# Patient Record
Sex: Male | Born: 1965 | Race: White | Hispanic: No | Marital: Single | Smoking: Former smoker
Health system: Southern US, Community
[De-identification: ages and names within clinical notes are randomized; demographics above are authoritative.]

## PROBLEM LIST (undated history)

## (undated) DIAGNOSIS — M549 Dorsalgia, unspecified: Secondary | ICD-10-CM

## (undated) HISTORY — PX: SPINAL FUSION: SHX223

## (undated) HISTORY — PX: SKIN BIOPSY: SHX1

## (undated) HISTORY — PX: ARM DEBRIDEMENT: SHX890

## (undated) HISTORY — PX: ELBOW SURGERY: SHX618

## (undated) HISTORY — PX: SHOULDER SURGERY: SHX246

## (undated) HISTORY — PX: APPENDECTOMY: SHX54

---

## 2003-03-04 ENCOUNTER — Emergency Department (HOSPITAL_COMMUNITY): Admission: EM | Admit: 2003-03-04 | Discharge: 2003-03-04 | Payer: Self-pay | Admitting: Emergency Medicine

## 2003-03-04 ENCOUNTER — Encounter: Payer: Self-pay | Admitting: Emergency Medicine

## 2003-03-05 ENCOUNTER — Emergency Department (HOSPITAL_COMMUNITY): Admission: EM | Admit: 2003-03-05 | Discharge: 2003-03-05 | Payer: Self-pay | Admitting: Emergency Medicine

## 2003-03-24 ENCOUNTER — Emergency Department (HOSPITAL_COMMUNITY): Admission: EM | Admit: 2003-03-24 | Discharge: 2003-03-24 | Payer: Self-pay | Admitting: Emergency Medicine

## 2003-04-02 ENCOUNTER — Encounter (HOSPITAL_COMMUNITY): Admission: RE | Admit: 2003-04-02 | Discharge: 2003-05-02 | Payer: Self-pay | Admitting: Orthopaedic Surgery

## 2003-08-31 ENCOUNTER — Emergency Department (HOSPITAL_COMMUNITY): Admission: EM | Admit: 2003-08-31 | Discharge: 2003-08-31 | Payer: Self-pay | Admitting: Emergency Medicine

## 2004-11-16 ENCOUNTER — Emergency Department (HOSPITAL_COMMUNITY): Admission: EM | Admit: 2004-11-16 | Discharge: 2004-11-17 | Payer: Self-pay | Admitting: Emergency Medicine

## 2004-11-24 ENCOUNTER — Emergency Department (HOSPITAL_COMMUNITY): Admission: EM | Admit: 2004-11-24 | Discharge: 2004-11-24 | Payer: Self-pay | Admitting: Emergency Medicine

## 2006-08-12 ENCOUNTER — Emergency Department (HOSPITAL_COMMUNITY): Admission: EM | Admit: 2006-08-12 | Discharge: 2006-08-12 | Payer: Self-pay | Admitting: Emergency Medicine

## 2006-08-24 ENCOUNTER — Emergency Department (HOSPITAL_COMMUNITY): Admission: EM | Admit: 2006-08-24 | Discharge: 2006-08-24 | Payer: Self-pay | Admitting: Emergency Medicine

## 2006-09-11 ENCOUNTER — Emergency Department (HOSPITAL_COMMUNITY): Admission: EM | Admit: 2006-09-11 | Discharge: 2006-09-11 | Payer: Self-pay | Admitting: Emergency Medicine

## 2006-11-13 ENCOUNTER — Emergency Department (HOSPITAL_COMMUNITY): Admission: EM | Admit: 2006-11-13 | Discharge: 2006-11-13 | Payer: Self-pay | Admitting: Emergency Medicine

## 2007-05-14 ENCOUNTER — Emergency Department (HOSPITAL_COMMUNITY): Admission: EM | Admit: 2007-05-14 | Discharge: 2007-05-14 | Payer: Self-pay | Admitting: Emergency Medicine

## 2007-06-11 ENCOUNTER — Emergency Department (HOSPITAL_COMMUNITY): Admission: EM | Admit: 2007-06-11 | Discharge: 2007-06-11 | Payer: Self-pay | Admitting: Emergency Medicine

## 2009-04-13 ENCOUNTER — Emergency Department (HOSPITAL_COMMUNITY): Admission: EM | Admit: 2009-04-13 | Discharge: 2009-04-13 | Payer: Self-pay | Admitting: Emergency Medicine

## 2010-10-13 ENCOUNTER — Emergency Department (HOSPITAL_COMMUNITY)
Admission: EM | Admit: 2010-10-13 | Discharge: 2010-10-13 | Payer: Self-pay | Source: Home / Self Care | Admitting: Emergency Medicine

## 2011-04-18 ENCOUNTER — Emergency Department (HOSPITAL_COMMUNITY)
Admission: EM | Admit: 2011-04-18 | Discharge: 2011-04-18 | Disposition: A | Discharge status: Home / Self Care | Payer: Self-pay | Attending: Emergency Medicine | Admitting: Emergency Medicine

## 2011-04-18 ENCOUNTER — Encounter: Payer: Self-pay | Admitting: Emergency Medicine

## 2011-04-18 DIAGNOSIS — K029 Dental caries, unspecified: Secondary | ICD-10-CM | POA: Insufficient documentation

## 2011-04-18 DIAGNOSIS — F17200 Nicotine dependence, unspecified, uncomplicated: Secondary | ICD-10-CM | POA: Insufficient documentation

## 2011-04-18 MED ORDER — HYDROCODONE-ACETAMINOPHEN 5-325 MG PO TABS: 1.0000 | ORAL_TABLET | ORAL | Status: AC | PRN

## 2011-04-18 MED ORDER — PENICILLIN V POTASSIUM 500 MG PO TABS: 500.0000 mg | ORAL_TABLET | Freq: Four times a day (QID) | ORAL | Status: AC

## 2011-04-18 MED ORDER — OXYCODONE-ACETAMINOPHEN 5-325 MG PO TABS
1.0000 | ORAL_TABLET | Freq: Once | ORAL | Status: AC
  Administered 2011-04-18: 1 via ORAL
  Filled 2011-04-18: qty 1

## 2011-04-18 MED ORDER — PENICILLIN V POTASSIUM 250 MG PO TABS
500.0000 mg | ORAL_TABLET | Freq: Once | ORAL | Status: AC
Start: 2011-04-18 — End: 2011-04-18
  Administered 2011-04-18: 500 mg via ORAL
  Filled 2011-04-18: qty 2

## 2011-04-18 NOTE — ED Notes (Addendum)
No change. Aware PA will be in asap.

## 2011-04-18 NOTE — ED Provider Notes (Signed)
History     Chief Complaint  Patient presents with  . Dental Pain   Patient is a 45 y.o. male presenting with tooth pain. The history is provided by the patient.  Dental PainThe primary symptoms include mouth pain. Primary symptoms do not include headaches, fever, sore throat or angioedema. Primary symptoms comment: dental caries The symptoms began 2 days ago. The symptoms are unchanged. The symptoms are recurrent. The symptoms occur constantly.  Mouth pain began 24 -48 hours ago. Mouth pain occurs constantly. Affected locations include: teeth. At its highest the mouth pain was at 9/10. The mouth pain is currently at 9/10.  Additional symptoms include: gum tenderness. Additional symptoms do not include: gum swelling, trismus, jaw pain, facial swelling, trouble swallowing and ear pain. Medical issues include: periodontal disease.    History reviewed. No pertinent past medical history.  Past Surgical History  Procedure Date  . Appendectomy   . Spinal fusion   . Arm debridement   . Skin biopsy     Family History  Problem Relation Age of Onset  . Hypertension Father   . Asthma Sister     History  Substance Use Topics  . Smoking status: Smoker, Current Status Unknown -- 0.2 packs/day  . Smokeless tobacco: Not on file  . Alcohol Use: No      Review of Systems  Constitutional: Negative.  Negative for fever.  HENT: Positive for dental problem. Negative for ear pain, sore throat, facial swelling and trouble swallowing.   Respiratory: Negative.   Cardiovascular: Negative.   Musculoskeletal: Negative.   Skin: Negative.   Neurological: Negative for headaches.  Hematological: Does not bruise/bleed easily.    Physical Exam  BP 135/100  Pulse 120  Temp(Src) 98.9 F (37.2 C) (Oral)  Resp 22  Ht 5\' 9"  (1.753 m)  Wt 163 lb (73.936 kg)  BMI 24.07 kg/m2  SpO2 98%  Physical Exam  Constitutional: He is oriented to person, place, and time. He appears well-developed and  well-nourished.  Non-toxic appearance.  HENT:  Head: Normocephalic and atraumatic. No trismus in the jaw.  Mouth/Throat: Oropharynx is clear and moist and mucous membranes are normal. Dental caries present. No dental abscesses or uvula swelling.       Multiple dental caries with widespread dental decay.  No periapical abscess  Eyes: EOM are normal. Pupils are equal, round, and reactive to light.  Neck: Normal range of motion. Neck supple.  Cardiovascular: Normal rate, regular rhythm and normal heart sounds.   Pulmonary/Chest: Effort normal and breath sounds normal.  Abdominal: Soft. There is no tenderness.  Musculoskeletal: Normal range of motion.  Lymphadenopathy:    He has no cervical adenopathy.  Neurological: He is alert and oriented to person, place, and time.  Skin: Skin is warm and dry.  Psychiatric: He has a normal mood and affect.    ED Course  Procedures  MDM    PAtient is alert, appears anxious      Tammy L. [place], [place] 04/18/11 1338  Evaluation and management were performed by NP/PA under my supervision/collaboration.  Colette Dicamillo B.   Ahmaud Duthie B. Bernette Mayers, MD 04/20/11 [phone]

## 2011-04-18 NOTE — ED Notes (Signed)
l side upper and lower toothache x 2 days. Pt has appt at dental clinic in 3 weeks

## 2011-07-23 LAB — URINALYSIS, ROUTINE W REFLEX MICROSCOPIC
Bilirubin Urine: NEGATIVE
Glucose, UA: NEGATIVE
Ketones, ur: NEGATIVE
Leukocytes, UA: NEGATIVE
Nitrite: NEGATIVE
Protein, ur: NEGATIVE
Specific Gravity, Urine: <1.005 — ABNORMAL LOW
Urobilinogen, UA: 0.2
pH: 5.5

## 2011-07-23 LAB — URINE MICROSCOPIC-ADD ON

## 2011-07-26 LAB — CBC
HCT: 41.1
Hemoglobin: 14
MCHC: 34
MCV: 91.4
Platelets: 331
RBC: 4.5
RDW: 14.9 — ABNORMAL HIGH
WBC: 14.5 — ABNORMAL HIGH

## 2011-07-26 LAB — DIFFERENTIAL
Basophils Absolute: 0.2 — ABNORMAL HIGH
Basophils Relative: 1
Eosinophils Absolute: 0.7
Eosinophils Relative: 5
Lymphocytes Relative: 36
Lymphs Abs: 5.2 — ABNORMAL HIGH
Monocytes Absolute: 0.8 — ABNORMAL HIGH
Monocytes Relative: 5
Neutro Abs: 7.7
Neutrophils Relative %: 53

## 2011-07-26 LAB — URINALYSIS, ROUTINE W REFLEX MICROSCOPIC
Bilirubin Urine: NEGATIVE
Glucose, UA: NEGATIVE
Ketones, ur: NEGATIVE
Leukocytes, UA: NEGATIVE
Nitrite: NEGATIVE
Protein, ur: NEGATIVE
Specific Gravity, Urine: 1.02
Urobilinogen, UA: 0.2
pH: 6.5

## 2011-07-26 LAB — URINE MICROSCOPIC-ADD ON

## 2011-07-26 LAB — BASIC METABOLIC PANEL
BUN: 6
CO2: 15 — ABNORMAL LOW
Calcium: 8.9
Chloride: 100
Creatinine, Ser: 0.99
GFR calc Af Amer: >60
GFR calc non Af Amer: >60
Glucose, Bld: 112 — ABNORMAL HIGH
Potassium: 3.6
Sodium: 139

## 2011-08-03 ENCOUNTER — Emergency Department (HOSPITAL_COMMUNITY): Payer: Self-pay

## 2011-08-03 ENCOUNTER — Emergency Department (HOSPITAL_COMMUNITY)
Admission: EM | Admit: 2011-08-03 | Discharge: 2011-08-03 | Disposition: A | Discharge status: Home / Self Care | Payer: Self-pay | Attending: Emergency Medicine | Admitting: Emergency Medicine

## 2011-08-03 ENCOUNTER — Encounter (HOSPITAL_COMMUNITY): Payer: Self-pay

## 2011-08-03 DIAGNOSIS — M549 Dorsalgia, unspecified: Secondary | ICD-10-CM | POA: Insufficient documentation

## 2011-08-03 DIAGNOSIS — S20229A Contusion of unspecified back wall of thorax, initial encounter: Secondary | ICD-10-CM | POA: Insufficient documentation

## 2011-08-03 DIAGNOSIS — K029 Dental caries, unspecified: Secondary | ICD-10-CM | POA: Insufficient documentation

## 2011-08-03 DIAGNOSIS — K0889 Other specified disorders of teeth and supporting structures: Secondary | ICD-10-CM

## 2011-08-03 DIAGNOSIS — F17200 Nicotine dependence, unspecified, uncomplicated: Secondary | ICD-10-CM | POA: Insufficient documentation

## 2011-08-03 DIAGNOSIS — W19XXXA Unspecified fall, initial encounter: Secondary | ICD-10-CM

## 2011-08-03 DIAGNOSIS — K089 Disorder of teeth and supporting structures, unspecified: Secondary | ICD-10-CM | POA: Insufficient documentation

## 2011-08-03 DIAGNOSIS — W108XXA Fall (on) (from) other stairs and steps, initial encounter: Secondary | ICD-10-CM | POA: Insufficient documentation

## 2011-08-03 HISTORY — DX: Dorsalgia, unspecified: M54.9

## 2011-08-03 MED ORDER — OXYCODONE-ACETAMINOPHEN 5-325 MG PO TABS: ORAL_TABLET | ORAL | Status: AC

## 2011-08-03 MED ORDER — FENTANYL CITRATE 0.05 MG/ML IJ SOLN
100.0000 ug | Freq: Once | INTRAMUSCULAR | Status: AC
  Administered 2011-08-03: 100 ug via INTRAMUSCULAR
  Filled 2011-08-03: qty 2

## 2011-08-03 MED ORDER — PENICILLIN V POTASSIUM 250 MG PO TABS: 250.0000 mg | ORAL_TABLET | Freq: Four times a day (QID) | ORAL | Status: AC

## 2011-08-03 MED ORDER — OXYCODONE-ACETAMINOPHEN 5-325 MG PO TABS
2.0000 | ORAL_TABLET | Freq: Once | ORAL | Status: AC
  Administered 2011-08-03: 2 via ORAL
  Filled 2011-08-03: qty 2

## 2011-08-03 NOTE — ED Notes (Signed)
Pt c/o pain in his right and left upper mouth and right lower mouth from toothache. Pt also c/o pain in his lower back, mid back and neck from a fall this am. Pt able to ambulate without difficulty. Multiple blackened broken teeth noted in upper and lower mouth. No bruising noted on back.

## 2011-08-03 NOTE — ED Provider Notes (Signed)
History     CSN: [id number] Arrival date & time: 08/03/2011  1:11 PM      Chief Complaint  Patient presents with  . Dental Pain     HPI Pt was seen at 1350.  Per pt, c/o gradual onset and persistence of constant teeth "pain" x3 days.  Denies fevers, no intra-oral edema, no rash, no facial swelling, no dysphagia, no neck pain.   The condition is aggravated by nothing. The condition is relieved by nothing. Pt also c/o sudden onset and resolution of one episode of slip and fall on steps this morning.  States he landed on the left side of his back and "it still hurts."  Denies CP/SOB, no cough, no rash, no abd pain, no incont/retention of bowel or bladder, no saddle anesthesia, no focal motor weakness, no tingling/numbness in extremities.     Past Medical History  Diagnosis Date  . Back pain     Past Surgical History  Procedure Date  . Appendectomy   . Spinal fusion   . Arm debridement   . Skin biopsy   . Shoulder surgery     Family History  Problem Relation Age of Onset  . Hypertension Father   . Asthma Sister     History  Substance Use Topics  . Smoking status: Smoker, Current Status Unknown -- 0.2 packs/day  . Smokeless tobacco: Not on file  . Alcohol Use: No      Review of Systems ROS: Statement: All systems negative except as marked or noted in the HPI; Constitutional: Negative for fever and chills. ; ; Eyes: Negative for eye pain and discharge. ; ; ENMT: Positive for dental caries, dental hygiene poor and toothache. Negative for ear pain, bleeding gums, dental injury, facial deformity, facial swelling, hoarseness, nasal congestion, sinus pressure, sore throat, throat swelling and tongue swollen. ; ; Cardiovascular: Negative for chest pain, palpitations, diaphoresis, dyspnea and peripheral edema. ; ; Respiratory: Negative for cough, wheezing and stridor. ; ; Gastrointestinal: Negative for nausea, vomiting, diarrhea and abdominal pain. ; ; Genitourinary: Negative for  dysuria, flank pain and hematuria. ; ; Musculoskeletal: +back pain. Negative for neck pain. ; ; Skin: Negative for rash and skin lesion. ; ; Neuro: Negative for syncope, headache, lightheadedness and neck stiffness.    Allergies  Aspirin  Home Medications   Current Outpatient Rx  Name Route Sig Dispense Refill  . ASPIRIN-ACETAMINOPHEN 500-325 MG PO PACK Oral Take 1 packet by mouth every 12 (twelve) hours as needed. For pain       BP 115/67  Pulse 130  Temp(Src) 98.8 F (37.1 C) (Oral)  Resp 22  Ht 5\' 8"  (1.727 m)  Wt 163 lb (73.936 kg)  BMI 24.78 kg/m2  SpO2 97%  Physical Exam 1350: Physical examination:  Nursing notes reviewed; Vital signs and O2 SAT reviewed;  Constitutional: Well developed, Well nourished, Well hydrated, In no acute distress; Head:  Normocephalic, atraumatic; Eyes: EOMI, PERRL, No scleral icterus;ENMT: Mouth and pharynx normal, Poor dentition, Widespread dental decay, Left TM normal, Right TM normal, Mucous membranes moist, +bilat upper and lower right and left teeth with extensive dental decay.  No gingival erythema, edema, fluctuance, or drainage.  No hoarse voice, no drooling, no stridor.  ; Neck: Supple, Full range of motion, No lymphadenopathy; Cardiovascular: Regular rate and rhythm, No murmur, rub, or gallop; Respiratory: Breath sounds clear & equal bilaterally, No rales, rhonchi, wheezes, or rub, Normal respiratory effort/excursion; Chest: Nontender, Movement normal; Abdomen: Soft, Nontender, Nondistended, Normal bowel  sounds; Genitourinary: No CVA tenderness; Spine:  No midline CS, TS, LS tenderness.  Well healed surgical scar LS spine.  +mild TTP left lower thoracic and lumbar paraspinal muscles.  No abrasions, no ecchymosis, no soft tissue crepitus. Extremities: Pulses normal, No tenderness, No edema, No calf edema or asymmetry.; Neuro: AA&Ox3, Major CN grossly intact.  No gross focal motor or sensory deficits in extremities.; Skin: Color normal, Warm, Dry, no  rash, no petechiae.    ED Course  Procedures  MDM  MDM Reviewed: nursing note and vitals Interpretation: x-ray    Dg Chest 2 View  08/03/2011  *RADIOLOGY REPORT*  Clinical Data: Back pain, fall  CHEST - 2 VIEW  Comparison: None  Findings: Normal heart size, mediastinal contours, and pulmonary vascularity. Bronchitic and probable emphysematous changes. No infiltrate, pleural effusion or pneumothorax. Age indeterminate mild anterior height loss of a mid thoracic vertebra. Scattered endplate spur formation thoracic spine.  IMPRESSION: Bronchitic and question emphysematous changes. No acute pulmonary abnormalities. Age indeterminate mild anterior height loss of a mid thoracic vertebra; if this is potentially acute in origin, consider CT assessment.  Original Report Authenticated By: Lollie Marrow, M.D.   Dg Lumbar Spine Complete  08/03/2011  *RADIOLOGY REPORT*  Clinical Data: Back pain, fall  LUMBAR SPINE - COMPLETE 4+ VIEW  Comparison: None Correlation:  CT abdomen and pelvis 05/14/2007  Findings: Hypoplastic last rib pair. Five non-rib bearing lumbar vertebrae. Vertebral body and disc space heights maintained. No acute fracture, subluxation or bone destruction. Minimal atherosclerotic calcification aorta. No spondylolysis. Small bony densities are seen within the soft tissues dorsal to the spinous process of L5, unchanged since the prior CT, question related to prior surgery or trauma.  IMPRESSION: No acute lumbar spine abnormalities.  Original Report Authenticated By: Lollie Marrow, M.D.   Ct T Spine Ltd Wo Or W/ Cm  08/03/2011  *RADIOLOGY REPORT*  Clinical Data:  Back pain, fall, abnormal appearance of the thoracic vertebra on chest radiograph  CT THORACIC SPINE WITHOUT CONTRAST  Technique:  Multidetector CT imaging of the thoracic spine was performed from T6-T10 without intravenous contrast administration. Multiplanar CT image reconstructions were also generated.  Comparison: Chest radiograph  08/03/2011  Findings: Multilevel disc space narrowing endplate spur formation involving the imaged segments of the thoracic spine. Vertebral body heights appear maintained without fracture or subluxation. Bony spinal canal appears patent. Visualized portions of the adjacent lungs are normal appearance. No paraspinal hematoma, pleural effusion or rib fracture. No regional bone destruction seen.  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 No thoracic spine fracture identified. The appearing height loss of the T7 vertebral body seen on the preceding lateral chest radiograph was artifactual.  Original Report Authenticated By: Lollie Marrow, M.D.    1645:  No fx on f/u CT scan of TS.  Feels improved after meds and wants to go home now.  Dx testing d/w pt.  Questions answered.  Verb understanding, agreeable to d/c home with outpt f/u.   Medications given in ED:  oxyCODONE-acetaminophen (PERCOCET) 5-325 MG per tablet 2 tablet (2 tablet Oral Given 08/03/11 1416)  fentaNYL (SUBLIMAZE) 0.05 MG/ML injection 100 mcg (100 mcg Intramuscular Given 08/03/11 1635)    New Prescriptions   OXYCODONE-ACETAMINOPHEN (PERCOCET) 5-325 MG PER TABLET    1 or 2 tabs PO q6h prn pain   PENICILLIN V POTASSIUM (VEETID) 250 MG TABLET    Take 1 tablet (250 mg total) by mouth 4 (  four) times daily.     Yann Biehn Allison Quarry, DO 08/05/11 2014

## 2011-08-03 NOTE — ED Notes (Signed)
Pt c/o dental pain x 3 days.  Also says fell today and c/o low and mid back pain.

## 2011-11-19 ENCOUNTER — Encounter (HOSPITAL_COMMUNITY): Payer: Self-pay

## 2011-11-19 ENCOUNTER — Emergency Department (HOSPITAL_COMMUNITY)
Admission: EM | Admit: 2011-11-19 | Discharge: 2011-11-19 | Disposition: A | Discharge status: Home / Self Care | Payer: Self-pay | Attending: Emergency Medicine | Admitting: Emergency Medicine

## 2011-11-19 DIAGNOSIS — K029 Dental caries, unspecified: Secondary | ICD-10-CM | POA: Insufficient documentation

## 2011-11-19 DIAGNOSIS — K047 Periapical abscess without sinus: Secondary | ICD-10-CM | POA: Insufficient documentation

## 2011-11-19 DIAGNOSIS — F17200 Nicotine dependence, unspecified, uncomplicated: Secondary | ICD-10-CM | POA: Insufficient documentation

## 2011-11-19 MED ORDER — AMOXICILLIN 500 MG PO CAPS: 500.0000 mg | ORAL_CAPSULE | Freq: Three times a day (TID) | ORAL | Status: AC

## 2011-11-19 MED ORDER — OXYCODONE-ACETAMINOPHEN 5-325 MG PO TABS
1.0000 | ORAL_TABLET | Freq: Once | ORAL | Status: AC
  Administered 2011-11-19: 1 via ORAL
  Filled 2011-11-19: qty 1

## 2011-11-19 MED ORDER — AMOXICILLIN 250 MG PO CAPS
500.0000 mg | ORAL_CAPSULE | Freq: Once | ORAL | Status: AC
  Administered 2011-11-19: 500 mg via ORAL
  Filled 2011-11-19: qty 2

## 2011-11-19 MED ORDER — OXYCODONE-ACETAMINOPHEN 5-325 MG PO TABS: 1.0000 | ORAL_TABLET | ORAL | Status: AC | PRN

## 2011-11-19 NOTE — ED Provider Notes (Signed)
History     CSN: [id number]  Arrival date & time 11/19/11  1254   First MD Initiated Contact with Patient 11/19/11 1303      Chief Complaint  Patient presents with  . Dental Pain    (Consider location/radiation/quality/duration/timing/severity/associated sxs/prior treatment) Patient is a 46 y.o. male presenting with tooth pain. The history is provided by the patient.  Dental PainThe primary symptoms include mouth pain. Primary symptoms do not include headaches, fever, shortness of breath or sore throat. The symptoms began more than 1 week ago. The symptoms are worsening. The symptoms are recurrent. The symptoms occur constantly.  Additional symptoms include: dental sensitivity to temperature, gum swelling, gum tenderness and purulent gums. Additional symptoms do not include: jaw pain, facial swelling, trouble swallowing and swollen glands.    Past Medical History  Diagnosis Date  . Back pain     Past Surgical History  Procedure Date  . Appendectomy   . Spinal fusion   . Arm debridement   . Skin biopsy   . Shoulder surgery     Family History  Problem Relation Age of Onset  . Hypertension Father   . Asthma Sister     History  Substance Use Topics  . Smoking status: Smoker, Current Status Unknown -- 0.2 packs/day  . Smokeless tobacco: Not on file  . Alcohol Use: No      Review of Systems  Constitutional: Negative for fever.  HENT: Positive for dental problem. Negative for congestion, sore throat, facial swelling, trouble swallowing and neck pain.   Eyes: Negative.   Respiratory: Negative for chest tightness and shortness of breath.   Cardiovascular: Negative for chest pain.  Gastrointestinal: Negative for nausea and abdominal pain.  Genitourinary: Negative.   Musculoskeletal: Negative for joint swelling and arthralgias.  Skin: Negative.  Negative for rash and wound.  Neurological: Negative for dizziness, weakness, light-headedness, numbness and headaches.    Hematological: Negative.   Psychiatric/Behavioral: Negative.     Allergies  Aspirin  Home Medications   Current Outpatient Rx  Name Route Sig Dispense Refill  . AMOXICILLIN 500 MG PO CAPS Oral Take 1 capsule (500 mg total) by mouth 3 (three) times daily. 30 capsule 0  . ASPIRIN-ACETAMINOPHEN 500-325 MG PO PACK Oral Take 1 packet by mouth every 12 (twelve) hours as needed. For pain     . OXYCODONE-ACETAMINOPHEN 5-325 MG PO TABS Oral Take 1 tablet by mouth every 4 (four) hours as needed for pain. 20 tablet 0    BP 131/85  Pulse 121  Temp(Src) 98.3 F (36.8 C) (Oral)  Resp 18  Ht 5\' 8"  (1.727 m)  Wt 161 lb 8 oz (73.256 kg)  BMI 24.56 kg/m2  SpO2 99%  Physical Exam  Constitutional: He is oriented to person, place, and time. He appears well-developed and well-nourished. No distress.  HENT:  Head: Normocephalic and atraumatic.  Right Ear: Tympanic membrane and external ear normal.  Left Ear: Tympanic membrane and external ear normal.  Mouth/Throat: Oropharynx is clear and moist and mucous membranes are normal. No oral lesions. Dental abscesses present. No oropharyngeal exudate.       Poor dentition. Partially edentulous. Multiple fractured teeth, severe decay and multiple locations of increased gingival edema. No obvious abscess noted.  Eyes: Conjunctivae are normal.  Neck: Normal range of motion. Neck supple.  Cardiovascular: Normal rate and normal heart sounds.   Pulmonary/Chest: Effort normal.  Abdominal: He exhibits no distension.  Musculoskeletal: Normal range of motion.  Lymphadenopathy:  He has no cervical adenopathy.  Neurological: He is alert and oriented to person, place, and time.  Skin: Skin is warm and dry. No erythema.  Psychiatric: He has a normal mood and affect.    ED Course  Procedures (including critical care time)  Labs Reviewed - No data to display No results found.   1. Dental caries   2. Dental abscess       MDM  Dental resource guide  given. Percocet, amoxicillin.        Candis Musa, PA 11/19/11 1346

## 2011-11-19 NOTE — ED Notes (Signed)
Pt presents with multiple broken and blackened teeth. Pt states pain is "all over" mouth.

## 2011-11-19 NOTE — ED Provider Notes (Signed)
Medical screening examination/treatment/procedure(s) were performed by non-physician practitioner and as supervising physician I was immediately available for consultation/collaboration.   Joya Gaskins, MD 11/19/11 [phone]

## 2012-03-15 ENCOUNTER — Emergency Department (HOSPITAL_COMMUNITY)
Admission: EM | Admit: 2012-03-15 | Discharge: 2012-03-15 | Disposition: A | Discharge status: Home / Self Care | Payer: Self-pay | Attending: Emergency Medicine | Admitting: Emergency Medicine

## 2012-03-15 ENCOUNTER — Encounter (HOSPITAL_COMMUNITY): Payer: Self-pay | Admitting: Oncology

## 2012-03-15 DIAGNOSIS — K0889 Other specified disorders of teeth and supporting structures: Secondary | ICD-10-CM

## 2012-03-15 DIAGNOSIS — K029 Dental caries, unspecified: Secondary | ICD-10-CM | POA: Insufficient documentation

## 2012-03-15 DIAGNOSIS — Z87891 Personal history of nicotine dependence: Secondary | ICD-10-CM | POA: Insufficient documentation

## 2012-03-15 MED ORDER — HYDROCODONE-ACETAMINOPHEN 5-325 MG PO TABS: 1.0000 | ORAL_TABLET | Freq: Four times a day (QID) | ORAL | Status: AC | PRN

## 2012-03-15 MED ORDER — PENICILLIN V POTASSIUM 500 MG PO TABS: 500.0000 mg | ORAL_TABLET | Freq: Four times a day (QID) | ORAL | Status: AC

## 2012-03-15 MED ORDER — PENICILLIN V POTASSIUM 250 MG PO TABS
500.0000 mg | ORAL_TABLET | Freq: Once | ORAL | Status: AC
  Administered 2012-03-15: 500 mg via ORAL
  Filled 2012-03-15: qty 2

## 2012-03-15 MED ORDER — HYDROCODONE-ACETAMINOPHEN 5-325 MG PO TABS
1.0000 | ORAL_TABLET | Freq: Once | ORAL | Status: AC
  Administered 2012-03-15: 1 via ORAL
  Filled 2012-03-15: qty 1

## 2012-03-15 MED ORDER — IBUPROFEN 800 MG PO TABS
800.0000 mg | ORAL_TABLET | Freq: Once | ORAL | Status: AC
  Administered 2012-03-15: 800 mg via ORAL
  Filled 2012-03-15: qty 1

## 2012-03-15 NOTE — ED Provider Notes (Signed)
Medical screening examination/treatment/procedure(s) were performed by non-physician practitioner and as supervising physician I was immediately available for consultation/collaboration.  Anvith Mauriello, MD 03/15/12 1548 

## 2012-03-15 NOTE — ED Provider Notes (Signed)
History     CSN: [id number]  Arrival date & time 03/15/12  1019   First MD Initiated Contact with Patient 03/15/12 1109      Chief Complaint  Patient presents with  . Dental Pain    (Consider location/radiation/quality/duration/timing/severity/associated sxs/prior treatment) HPI Comments: Plans on having all teeth pulled "when i get my disability in September".  Patient is a 46 y.o. male presenting with tooth pain. The history is provided by the patient. No language interpreter was used.  Dental PainThe primary symptoms include mouth pain. Primary symptoms do not include dental injury or fever. Episode onset: 3 days ago. The symptoms are unchanged. The symptoms occur constantly.  Additional symptoms include: jaw pain. Additional symptoms do not include: facial swelling.    Past Medical History  Diagnosis Date  . Back pain     Past Surgical History  Procedure Date  . Appendectomy   . Spinal fusion   . Arm debridement   . Skin biopsy   . Shoulder surgery     Family History  Problem Relation Age of Onset  . Hypertension Father   . Asthma Sister     History  Substance Use Topics  . Smoking status: Smoker, Current Status Unknown -- 0.2 packs/day  . Smokeless tobacco: Not on file  . Alcohol Use: No      Review of Systems  Constitutional: Negative for fever.  HENT: Positive for dental problem. Negative for facial swelling.   Cardiovascular: Negative for chest pain.  All other systems reviewed and are negative.    Allergies  Aspirin  Home Medications   Current Outpatient Rx  Name Route Sig Dispense Refill  . ASPIRIN-ACETAMINOPHEN 500-325 MG PO PACK Oral Take 1 packet by mouth every 12 (twelve) hours as needed. For pain     . HYDROCODONE-ACETAMINOPHEN 5-325 MG PO TABS Oral Take 1 tablet by mouth every 6 (six) hours as needed for pain. 20 tablet 0  . PENICILLIN V POTASSIUM 500 MG PO TABS Oral Take 1 tablet (500 mg total) by mouth 4 (four) times daily. 40  tablet 0    BP 137/91  Pulse 102  Temp(Src) 98.2 F (36.8 C) (Oral)  Resp 16  SpO2 98%  Physical Exam  Nursing note and vitals reviewed. Constitutional: He is oriented to person, place, and time. He appears well-developed and well-nourished.  HENT:  Head: Normocephalic and atraumatic.  Mouth/Throat: Uvula is midline and mucous membranes are normal. Dental caries present. No dental abscesses or uvula swelling.    Eyes: EOM are normal.  Neck: Normal range of motion.  Cardiovascular: Normal rate, regular rhythm, normal heart sounds and intact distal pulses.   Pulmonary/Chest: Effort normal and breath sounds normal. No respiratory distress.  Abdominal: Soft. He exhibits no distension. There is no tenderness.  Musculoskeletal: Normal range of motion.  Neurological: He is alert and oriented to person, place, and time.  Skin: Skin is warm and dry.  Psychiatric: He has a normal mood and affect. Judgment normal.    ED Course  Procedures (including critical care time)  Labs Reviewed - No data to display No results found.   1. Pain, dental       MDM  obvious dental decay rx-hydrocodone, 20 rx-pen VK 500 mg, 40 OTC ibuprofen 800 mg TID with food.   F/u with dentist of choice.      den  Worthy Rancher, [place] 03/15/12 1148

## 2012-03-15 NOTE — Discharge Instructions (Signed)
Dental Pain A tooth ache may be caused by cavities (tooth decay). Cavities expose the nerve of the tooth to air and hot or cold temperatures. It may come from an infection or abscess (also called a boil or furuncle) around your tooth. It is also often caused by dental caries (tooth decay). This causes the pain you are having. DIAGNOSIS  Your caregiver can diagnose this problem by exam. TREATMENT   If caused by an infection, it may be treated with medications which kill germs (antibiotics) and pain medications as prescribed by your caregiver. Take medications as directed.   Only take over-the-counter or prescription medicines for pain, discomfort, or fever as directed by your caregiver.   Whether the tooth ache today is caused by infection or dental disease, you should see your dentist as soon as possible for further care.  SEEK MEDICAL CARE IF: The exam and treatment you received today has been provided on an emergency basis only. This is not a substitute for complete medical or dental care. If your problem worsens or new problems (symptoms) appear, and you are unable to meet with your dentist, call or return to this location. SEEK IMMEDIATE MEDICAL CARE IF:   You have a fever.   You develop redness and swelling of your face, jaw, or neck.   You are unable to open your mouth.   You have severe pain uncontrolled by pain medicine.  MAKE SURE YOU:   Understand these instructions.   Will watch your condition.   Will get help right away if you are not doing well or get worse.  Document Released: 09/27/2005 Document Revised: 09/16/2011 Document Reviewed: 05/15/2008 [employer] Patient Information 2012 [employer]   Take the meds as directed.  Take ibuprofen up to 800 mg every 8 hrs with food.  Follow up with the dentist of your choice. 

## 2012-03-15 NOTE — ED Notes (Signed)
Alert, talking, call bell on , says he needs something for pain.  Told that PA would be in soon.

## 2012-03-15 NOTE — ED Notes (Signed)
[name] Conley reports dental pain to right upper/lower molars.

## 2012-04-13 ENCOUNTER — Encounter (HOSPITAL_COMMUNITY): Payer: Self-pay | Admitting: *Deleted

## 2012-04-13 ENCOUNTER — Emergency Department (HOSPITAL_COMMUNITY)
Admission: EM | Admit: 2012-04-13 | Discharge: 2012-04-13 | Disposition: A | Discharge status: Home / Self Care | Payer: Self-pay | Attending: Emergency Medicine | Admitting: Emergency Medicine

## 2012-04-13 ENCOUNTER — Emergency Department (HOSPITAL_COMMUNITY): Payer: Self-pay

## 2012-04-13 DIAGNOSIS — W108XXA Fall (on) (from) other stairs and steps, initial encounter: Secondary | ICD-10-CM | POA: Insufficient documentation

## 2012-04-13 DIAGNOSIS — S40029A Contusion of unspecified upper arm, initial encounter: Secondary | ICD-10-CM | POA: Insufficient documentation

## 2012-04-13 DIAGNOSIS — Z7982 Long term (current) use of aspirin: Secondary | ICD-10-CM | POA: Insufficient documentation

## 2012-04-13 DIAGNOSIS — S40021A Contusion of right upper arm, initial encounter: Secondary | ICD-10-CM

## 2012-04-13 DIAGNOSIS — M25519 Pain in unspecified shoulder: Secondary | ICD-10-CM | POA: Insufficient documentation

## 2012-04-13 DIAGNOSIS — W19XXXA Unspecified fall, initial encounter: Secondary | ICD-10-CM

## 2012-04-13 DIAGNOSIS — M25539 Pain in unspecified wrist: Secondary | ICD-10-CM | POA: Insufficient documentation

## 2012-04-13 DIAGNOSIS — F17200 Nicotine dependence, unspecified, uncomplicated: Secondary | ICD-10-CM | POA: Insufficient documentation

## 2012-04-13 MED ORDER — TRAMADOL HCL 50 MG PO TABS: 50.0000 mg | ORAL_TABLET | Freq: Four times a day (QID) | ORAL | Status: AC | PRN

## 2012-04-13 MED ORDER — HYDROCODONE-ACETAMINOPHEN 5-325 MG PO TABS
1.0000 | ORAL_TABLET | Freq: Once | ORAL | Status: AC
  Administered 2012-04-13: 1 via ORAL
  Filled 2012-04-13: qty 1

## 2012-04-13 NOTE — ED Notes (Signed)
Pt states that he fell yesterday due to his bottom steps "giving way", pt states that he landed on right hand, woke up this am with pain in right shoulder, right wrist and right hand, pt states that it is difficult for him to raise his right shoulder, cms intact distal

## 2012-04-13 NOTE — ED Provider Notes (Signed)
History    This chart was scribed for Dione Booze, MD, MD by Smitty Pluck. The patient was seen in room APFT20 and the patient's care was started at 3:42PM.   CSN: [id number]  Arrival date & time 04/13/12  1514   First MD Initiated Contact with Patient 04/13/12 1538      Chief Complaint  Patient presents with  . Fall  . Shoulder Pain  . Wrist Pain    (Consider location/radiation/quality/duration/timing/severity/associated sxs/prior treatment) The history is provided by the patient.   [name] Conley is a 46 y.o. male who presents to the Emergency Department complaining of fall onset 1 day ago while coming down steps. Pt reports catching himself with his hands. He has moderate right shoulder pain radiating down arm to hand onset this AM. Pt has taken one goody powder tablet without relief. Pt reports pain is 10/10. Pt has used heating pad without relief. Pt smokes 1 pack/week. Symptoms have been constant since onset. Pt reports he does not have control of his fingers in right hand. He reports loss of sensation in right hand. Denies head injury and LOC.  Surgeon Dr. Orvan Falconer   Past Medical History  Diagnosis Date  . Back pain     Past Surgical History  Procedure Date  . Appendectomy   . Spinal fusion   . Arm debridement   . Skin biopsy   . Shoulder surgery     Family History  Problem Relation Age of Onset  . Hypertension Father   . Asthma Sister     History  Substance Use Topics  . Smoking status: Current Everyday Smoker -- 0.2 packs/day  . Smokeless tobacco: Not on file  . Alcohol Use: No      Review of Systems  All other systems reviewed and are negative.   10 Systems reviewed and all are negative for acute change except as noted in the HPI.   Allergies  Aspirin  Home Medications   Current Outpatient Rx  Name Route Sig Dispense Refill  . ASPIRIN-ACETAMINOPHEN 500-325 MG PO PACK Oral Take 1 packet by mouth every 12 (twelve) hours as needed. For pain        BP 128/80  Pulse 97  Temp 98.8 F (37.1 C) (Oral)  Resp 20  SpO2 99%  Physical Exam  Nursing note and vitals reviewed. Constitutional: He is oriented to person, place, and time. He appears well-developed and well-nourished. No distress.  HENT:  Head: Normocephalic and atraumatic.  Eyes: Conjunctivae are normal. Pupils are equal, round, and reactive to light.  Neck: Normal range of motion. Neck supple.  Cardiovascular: Normal rate, regular rhythm and normal heart sounds.   Pulmonary/Chest: Effort normal. No respiratory distress.  Musculoskeletal:       Right arm has no deformity but is tender diffusely from shoulder to proximal metacarpals Pain with any passive ROM of shoulder, elbow or wrist No point tenderness Decreased sensation in entire right hand- not following any peripheral nerve pattern. Cap refill is prompt   Neurological: He is alert and oriented to person, place, and time.  Skin: Skin is warm and dry.  Psychiatric: He has a normal mood and affect. His behavior is normal.    ED Course  Procedures (including critical care time) DIAGNOSTIC STUDIES: Oxygen Saturation is 99% on room air, normal by my interpretation.    COORDINATION OF CARE: 3:44PM EDP discusses pt ED treatment with pt.  3:48PM EDP ordered medication: hydrocodone 1 tablet   Dg Forearm Right  04/13/2012  *RADIOLOGY REPORT*  Clinical Data: Fall, pain  RIGHT FOREARM - 2 VIEW  Comparison: none  Findings: Two views of the right forearm submitted.  No acute fracture or subluxation.  IMPRESSION: No acute fracture or subluxation.  Original Report Authenticated By: Natasha Mead, M.D.   Dg Wrist Complete Right  04/13/2012  *RADIOLOGY REPORT*  Clinical Data: Larey Seat with arm and wrist pain  RIGHT WRIST - COMPLETE 3+ VIEW  Comparison: None.  Findings: The radiocarpal joint space appears normal.  The ulnar styloid is intact and the carpal bones are in normal position.  IMPRESSION:  No acute abnormality.  Original Report  Authenticated By: Juline Patch, M.D.   Dg Humerus Right  04/13/2012  *RADIOLOGY REPORT*  Clinical Data: Fall, shoulder pain  RIGHT HUMERUS - 2+ VIEW  Comparison: None.  Findings: Two views of the right humerus submitted.  No acute fracture or subluxation.  No radiopaque foreign body.  IMPRESSION: No acute fracture or subluxation .  Original Report Authenticated By: Natasha Mead, M.D.      1. Fall   2. Contusion of arm, right       MDM  Fall with right arm injury. The patient's complaints of pain seemed out of proportion to physical findings. Also, complains of numbness in the right hand did not follow an anatomic pattern. His prior records are reviewed and he was seen one month ago for dental pain given a prescription for Norco. Reviewed the [place] controlled substance reporting website she is at the prescription was not filled until June 25. When asked about this, he states that his mother holds his prescriptions he thinks that there may be one or 2 pills left. X-rays have been ordered, but I am concerned that he is drug-seeking. Of note, the Norco prescription filled on June 25 is the only narcotic prescription on the [place] controlled substance reporting website.  X-rays are negative for fracture. He is given a sling for comfort and prescriptions given for tramadol.  I personally performed the services described in this documentation, which was scribed in my presence. The recorded information has been reviewed and considered.          Dione Booze, MD 04/13/12 [phone]

## 2012-05-12 ENCOUNTER — Emergency Department (HOSPITAL_COMMUNITY): Payer: Self-pay

## 2012-05-12 ENCOUNTER — Emergency Department (HOSPITAL_COMMUNITY)
Admission: EM | Admit: 2012-05-12 | Discharge: 2012-05-12 | Disposition: A | Discharge status: Home / Self Care | Payer: Self-pay | Attending: Emergency Medicine | Admitting: Emergency Medicine

## 2012-05-12 ENCOUNTER — Encounter (HOSPITAL_COMMUNITY): Payer: Self-pay | Admitting: *Deleted

## 2012-05-12 DIAGNOSIS — M25519 Pain in unspecified shoulder: Secondary | ICD-10-CM | POA: Insufficient documentation

## 2012-05-12 DIAGNOSIS — M549 Dorsalgia, unspecified: Secondary | ICD-10-CM | POA: Insufficient documentation

## 2012-05-12 DIAGNOSIS — M1990 Unspecified osteoarthritis, unspecified site: Secondary | ICD-10-CM | POA: Insufficient documentation

## 2012-05-12 DIAGNOSIS — R51 Headache: Secondary | ICD-10-CM | POA: Insufficient documentation

## 2012-05-12 DIAGNOSIS — M5030 Other cervical disc degeneration, unspecified cervical region: Secondary | ICD-10-CM | POA: Insufficient documentation

## 2012-05-12 LAB — GLUCOSE, CAPILLARY: Glucose-Capillary: 94 mg/dL (ref 70–99)

## 2012-05-12 MED ORDER — DIPHENHYDRAMINE HCL 50 MG/ML IJ SOLN
25.0000 mg | Freq: Once | INTRAMUSCULAR | Status: AC
  Administered 2012-05-12: 25 mg via INTRAVENOUS
  Filled 2012-05-12: qty 1

## 2012-05-12 MED ORDER — METOCLOPRAMIDE HCL 5 MG/ML IJ SOLN
10.0000 mg | Freq: Once | INTRAMUSCULAR | Status: AC
  Administered 2012-05-12: 10 mg via INTRAVENOUS
  Filled 2012-05-12: qty 2

## 2012-05-12 MED ORDER — DEXAMETHASONE 4 MG PO TABS: ORAL_TABLET | ORAL | Status: DC

## 2012-05-12 MED ORDER — BUTALBITAL-APAP-CAFFEINE 50-325-40 MG PO TABS: 1.0000 | ORAL_TABLET | Freq: Four times a day (QID) | ORAL | Status: DC | PRN

## 2012-05-12 NOTE — ED Provider Notes (Signed)
Pt seen with PA No facial droop No arm/leg drift (movement of right arm/leg limited due to pain, but no focal weakness) He is able to ambulate CT imaging already ordered He reports he has had these symptoms since July 7.  I doubt acute CVA or other acute neurologic process BP 119/64  Pulse 105  Temp 98.4 F (36.9 C) (Oral)  Resp 18  Ht 5' 8.5" (1.74 m)  Wt 150 lb (68.04 kg)  BMI 22.48 kg/m2  SpO2 100%   Joya Gaskins, MD 05/12/12 1204

## 2012-05-12 NOTE — ED Notes (Signed)
Headache, onset 3 days ago, worse today.  Pain rt shoulder with movement 7/7,   Pain in rt hip, and pain both knees  Alert, talking,

## 2012-05-12 NOTE — ED Notes (Signed)
Patient states he would like something for his headache. RN Crystal aware.

## 2012-05-12 NOTE — ED Provider Notes (Signed)
History     CSN: [id number]  Arrival date & time 05/12/12  1105   First MD Initiated Contact with Patient 05/12/12 1126      Chief Complaint  Patient presents with  . Headache    (Consider location/radiation/quality/duration/timing/severity/associated sxs/prior treatment) Patient is a 46 y.o. male presenting with headaches.  Headache  This is a recurrent problem. The current episode started more than 2 days ago. The problem occurs hourly. The problem has been gradually worsening. The headache is associated with bright light, activity and loud noise. The pain is located in the right unilateral region. The quality of the pain is described as throbbing. The pain is severe. The pain radiates to the right shoulder. Pertinent negatives include no palpitations and no shortness of breath. He has tried NSAIDs for the symptoms. The treatment provided no relief.    Past Medical History  Diagnosis Date  . Back pain     Past Surgical History  Procedure Date  . Appendectomy   . Spinal fusion   . Arm debridement   . Skin biopsy   . Shoulder surgery   . Elbow surgery     Family History  Problem Relation Age of Onset  . Hypertension Father   . Asthma Sister     History  Substance Use Topics  . Smoking status: Current Everyday Smoker -- 0.2 packs/day    Types: Cigarettes  . Smokeless tobacco: Not on file  . Alcohol Use: No      Review of Systems  Constitutional: Negative for activity change.       All ROS Neg except as noted in HPI  HENT: Negative for nosebleeds and neck pain.   Eyes: Negative for photophobia and discharge.  Respiratory: Negative for cough, shortness of breath and wheezing.   Cardiovascular: Negative for chest pain and palpitations.  Gastrointestinal: Negative for abdominal pain and blood in stool.  Genitourinary: Negative for dysuria, frequency and hematuria.  Musculoskeletal: Positive for back pain and arthralgias.  Skin: Negative.   Neurological:  Positive for headaches. Negative for dizziness, seizures and speech difficulty.  Psychiatric/Behavioral: Negative for hallucinations and confusion.    Allergies  Aspirin  Home Medications   Current Outpatient Rx  Name Route Sig Dispense Refill  . ASPIRIN-ACETAMINOPHEN 500-325 MG PO PACK Oral Take 1 packet by mouth every 12 (twelve) hours as needed. For pain     . GABAPENTIN 300 MG PO CAPS Oral Take 300 mg by mouth at bedtime.    Marland Kitchen LIDOCAINE 5 % EX PTCH Transdermal Place 1 patch onto the skin daily. Remove & Discard patch within 12 hours or as directed by MD    . BUTALBITAL-APAP-CAFFEINE 50-325-40 MG PO TABS Oral Take 1-2 tablets by mouth every 6 (six) hours as needed for headache. 20 tablet 0  . DEXAMETHASONE 4 MG PO TABS  2 tabs day one, then 1 po daily 8 tablet 0    BP 101/63  Pulse 87  Temp 98.3 F (36.8 C) (Oral)  Resp 18  Ht 5' 8.5" (1.74 m)  Wt 150 lb (68.04 kg)  BMI 22.48 kg/m2  SpO2 97%  Physical Exam  Nursing note and vitals reviewed. Constitutional: He is oriented to person, place, and time. He appears well-developed and well-nourished.  Non-toxic appearance.  HENT:  Head: Normocephalic.  Right Ear: Tympanic membrane and external ear normal.  Left Ear: Tympanic membrane and external ear normal.  Eyes: EOM and lids are normal. Pupils are equal, round, and reactive to light.  Neck: Normal range of motion. Neck supple. Carotid bruit is not present.  Cardiovascular: Normal rate, regular rhythm, normal heart sounds, intact distal pulses and normal pulses.   Pulmonary/Chest: Breath sounds normal. No respiratory distress.  Abdominal: Soft. Bowel sounds are normal. There is no tenderness. There is no guarding.  Musculoskeletal: Normal range of motion.       There is pain with attempted range of motion of the right shoulder. There is some muscle tightening in the upper trapezius area. There's no deformity of the shoulder.  There is soreness with range of motion of the  right hip. There is mild crepitus with range of motion of both knees. There is no effusion of the knees. There is no deformity of the right hip. The distal pulses are symmetrical.  Lymphadenopathy:       Head (right side): No submandibular adenopathy present.       Head (left side): No submandibular adenopathy present.    He has no cervical adenopathy.  Neurological: He is alert and oriented to person, place, and time. He has normal strength. No cranial nerve deficit or sensory deficit.  Skin: Skin is warm and dry.  Psychiatric: He has a normal mood and affect. His speech is normal.    ED Course  Procedures (including critical care time)   Labs Reviewed  GLUCOSE, CAPILLARY   Ct Head Wo Contrast  05/12/2012  *RADIOLOGY REPORT*  Clinical Data:  Left-sided head.  Left sided headache.  Right-sided weakness.  CT HEAD WITHOUT CONTRAST CT CERVICAL SPINE WITHOUT CONTRAST  Technique:  Multidetector CT imaging of the head and cervical spine was performed following the standard protocol without intravenous contrast.  Multiplanar CT image reconstructions of the cervical spine were also generated.  Comparison:  None available.  CT HEAD  Findings: No acute intracranial abnormality is present. Specifically, there is no evidence for acute infarct, hemorrhage, mass, hydrocephalus, or extra-axial fluid collection.  The paranasal sinuses and mastoid air cells are clear.  The globes and orbits are intact.  The osseous skull is intact.  IMPRESSION: Negative CT of the head.  CT CERVICAL SPINE  Findings: The cervical spine is imaged from the skull base through the upper thoracic spine.  The vertebral body heights alignment maintained.  Vascular calcifications are noted in the right carotid bifurcation without significant stenosis.  The prevertebral soft tissues are otherwise unremarkable.  The lung apices are clear.  Mild endplate degenerative changes are evident at C4-5.  No acute fracture or traumatic subluxation is  present.  IMPRESSION:  1.  No acute abnormality or evidence for significant stenosis. 2.  Mild degenerative changes at C4-5. 3.  Atherosclerotic changes at the right carotid bifurcation without definite stenosis.  Original Report Authenticated By: Jamesetta Orleans. MATTERN, M.D.   Ct Cervical Spine Wo Contrast  05/12/2012  *RADIOLOGY REPORT*  Clinical Data:  Left-sided head.  Left sided headache.  Right-sided weakness.  CT HEAD WITHOUT CONTRAST CT CERVICAL SPINE WITHOUT CONTRAST  Technique:  Multidetector CT imaging of the head and cervical spine was performed following the standard protocol without intravenous contrast.  Multiplanar CT image reconstructions of the cervical spine were also generated.  Comparison:  None available.  CT HEAD  Findings: No acute intracranial abnormality is present. Specifically, there is no evidence for acute infarct, hemorrhage, mass, hydrocephalus, or extra-axial fluid collection.  The paranasal sinuses and mastoid air cells are clear.  The globes and orbits are intact.  The osseous skull is intact.  IMPRESSION: Negative CT  of the head.  CT CERVICAL SPINE  Findings: The cervical spine is imaged from the skull base through the upper thoracic spine.  The vertebral body heights alignment maintained.  Vascular calcifications are noted in the right carotid bifurcation without significant stenosis.  The prevertebral soft tissues are otherwise unremarkable.  The lung apices are clear.  Mild endplate degenerative changes are evident at C4-5.  No acute fracture or traumatic subluxation is present.  IMPRESSION:  1.  No acute abnormality or evidence for significant stenosis. 2.  Mild degenerative changes at C4-5. 3.  Atherosclerotic changes at the right carotid bifurcation without definite stenosis.  Original Report Authenticated By: Jamesetta Orleans. MATTERN, M.D.     1. Headache   2. DJD (degenerative joint disease)       MDM  I have reviewed nursing notes, vital signs, and all  appropriate lab and imaging results for this patient. The CT scan of the head and neck reveal DJD at C4-5, but no other acute problem. Pt advised to seek a primary MD for completion of the work up and for management of the recurrent headache and joint pains. Rx for dexamethasone and fioricet given to the patient. Pt ambulated in room and hall. No acute deficit noted.       Kathie Dike, [place] 05/28/12 1028

## 2012-05-28 NOTE — ED Provider Notes (Signed)
Medical screening examination/treatment/procedure(s) were performed by non-physician practitioner and as supervising physician I was immediately available for consultation/collaboration.   Joya Gaskins, MD 05/28/12 2234

## 2012-06-16 ENCOUNTER — Emergency Department (HOSPITAL_COMMUNITY)
Admission: EM | Admit: 2012-06-16 | Discharge: 2012-06-16 | Disposition: A | Discharge status: Home / Self Care | Payer: Self-pay | Attending: Emergency Medicine | Admitting: Emergency Medicine

## 2012-06-16 ENCOUNTER — Encounter (HOSPITAL_COMMUNITY): Payer: Self-pay

## 2012-06-16 ENCOUNTER — Emergency Department (HOSPITAL_COMMUNITY): Payer: Self-pay

## 2012-06-16 DIAGNOSIS — F17200 Nicotine dependence, unspecified, uncomplicated: Secondary | ICD-10-CM | POA: Insufficient documentation

## 2012-06-16 DIAGNOSIS — S61409A Unspecified open wound of unspecified hand, initial encounter: Secondary | ICD-10-CM | POA: Insufficient documentation

## 2012-06-16 DIAGNOSIS — Y92009 Unspecified place in unspecified non-institutional (private) residence as the place of occurrence of the external cause: Secondary | ICD-10-CM | POA: Insufficient documentation

## 2012-06-16 DIAGNOSIS — Y990 Civilian activity done for income or pay: Secondary | ICD-10-CM | POA: Insufficient documentation

## 2012-06-16 DIAGNOSIS — W268XXA Contact with other sharp object(s), not elsewhere classified, initial encounter: Secondary | ICD-10-CM | POA: Insufficient documentation

## 2012-06-16 DIAGNOSIS — M25559 Pain in unspecified hip: Secondary | ICD-10-CM | POA: Insufficient documentation

## 2012-06-16 DIAGNOSIS — M25551 Pain in right hip: Secondary | ICD-10-CM

## 2012-06-16 DIAGNOSIS — W19XXXA Unspecified fall, initial encounter: Secondary | ICD-10-CM

## 2012-06-16 DIAGNOSIS — W01119A Fall on same level from slipping, tripping and stumbling with subsequent striking against unspecified sharp object, initial encounter: Secondary | ICD-10-CM | POA: Insufficient documentation

## 2012-06-16 DIAGNOSIS — S61411A Laceration without foreign body of right hand, initial encounter: Secondary | ICD-10-CM

## 2012-06-16 MED ORDER — TETANUS-DIPHTH-ACELL PERTUSSIS 5-2.5-18.5 LF-MCG/0.5 IM SUSP
0.5000 mL | Freq: Once | INTRAMUSCULAR | Status: DC
  Filled 2012-06-16 (×2): qty 0.5

## 2012-06-16 MED ORDER — CEPHALEXIN 500 MG PO CAPS: 500.0000 mg | ORAL_CAPSULE | Freq: Four times a day (QID) | ORAL | Status: AC

## 2012-06-16 MED ORDER — TETANUS-DIPHTHERIA TOXOIDS TD 5-2 LFU IM INJ
0.5000 mL | INJECTION | Freq: Once | INTRAMUSCULAR | Status: AC
  Administered 2012-06-16: 0.5 mL via INTRAMUSCULAR

## 2012-06-16 MED ORDER — HYDROMORPHONE HCL PF 1 MG/ML IJ SOLN
1.0000 mg | Freq: Once | INTRAMUSCULAR | Status: AC
  Administered 2012-06-16: 1 mg via INTRAMUSCULAR
  Filled 2012-06-16: qty 1

## 2012-06-16 MED ORDER — OXYCODONE-ACETAMINOPHEN 5-325 MG PO TABS: 1.0000 | ORAL_TABLET | ORAL | Status: AC | PRN

## 2012-06-16 NOTE — ED Notes (Signed)
Pt fell thru flooring of old house, app 2 feet drop.  Alert, pain in back and hips. Lac to rt  Hand, No HI, alert.

## 2012-06-16 NOTE — ED Provider Notes (Signed)
History     CSN: [id number]  Arrival date & time 06/16/12  1233   First MD Initiated Contact with Patient 06/16/12 1249      Chief Complaint  Patient presents with  . Laceration    (Consider location/radiation/quality/duration/timing/severity/associated sxs/prior treatment) HPI Comments: [name] Conley presents for evaluation of a lab to his right hand and pain in his right hip after falling yesterday when his foot broke the floor boards in a home he was working on.  During the fall he caught his right hand on a nail causing laceration to the right hand.  He claims his laceration using alcohol and has kept it bandaged but is concerned for possible infection he presents today for further management.  Pain is worse with palpation.  He describes pain in his right lateral hip which is also with movement and range of motion and palpation.  He denies any numbness or weakness in his right lower extremity.  He has used [place] powders without significant improvement in his pain.  He has also used antibiotic ointment on his hand laceration.  The history is provided by the patient.    Past Medical History  Diagnosis Date  . Back pain     Past Surgical History  Procedure Date  . Appendectomy   . Spinal fusion   . Arm debridement   . Skin biopsy   . Shoulder surgery   . Elbow surgery     Family History  Problem Relation Age of Onset  . Hypertension Father   . Asthma Sister     History  Substance Use Topics  . Smoking status: Current Everyday Smoker -- 0.2 packs/day    Types: Cigarettes  . Smokeless tobacco: Not on file  . Alcohol Use: No      Review of Systems  Constitutional: Negative for fever.  HENT: Negative for congestion, sore throat and neck pain.   Eyes: Negative.   Respiratory: Negative for chest tightness and shortness of breath.   Cardiovascular: Negative for chest pain.  Gastrointestinal: Negative for nausea and abdominal pain.  Genitourinary: Negative.     Musculoskeletal: Positive for arthralgias. Negative for joint swelling.  Skin: Positive for wound. Negative for rash.  Neurological: Negative for dizziness, weakness, light-headedness, numbness and headaches.  Hematological: Negative.   Psychiatric/Behavioral: Negative.     Allergies  Aspirin  Home Medications   Current Outpatient Rx  Name Route Sig Dispense Refill  . ASPIRIN-ACETAMINOPHEN 500-325 MG PO PACK Oral Take 1 packet by mouth every 12 (twelve) hours as needed. For pain     . GABAPENTIN 300 MG PO CAPS Oral Take 300 mg by mouth at bedtime.    Marland Kitchen LIDOCAINE 5 % EX PTCH Transdermal Place 1 patch onto the skin daily. Remove & Discard patch within 12 hours or as directed by MD    . CEPHALEXIN 500 MG PO CAPS Oral Take 1 capsule (500 mg total) by mouth 4 (four) times daily. 28 capsule 0  . OXYCODONE-ACETAMINOPHEN 5-325 MG PO TABS Oral Take 1 tablet by mouth every 4 (four) hours as needed for pain. 20 tablet 0    BP 116/93  Pulse 108  Temp 98.5 F (36.9 C) (Oral)  Resp 20  Ht 5\' 8"  (1.727 m)  Wt 155 lb (70.308 kg)  BMI 23.57 kg/m2  SpO2 99%  Physical Exam  Nursing note and vitals reviewed. Constitutional: He appears well-developed and well-nourished.  HENT:  Head: Normocephalic and atraumatic.  Eyes: Conjunctivae are normal.  Neck: Normal  range of motion.  Cardiovascular: Normal rate, regular rhythm, normal heart sounds and intact distal pulses.        Vital signs reviewed.  Patient is not tachycardic at time of exam.  Pulmonary/Chest: Effort normal and breath sounds normal. He has no wheezes.  Abdominal: Soft. Bowel sounds are normal. There is no tenderness.  Musculoskeletal: Normal range of motion. He exhibits tenderness. He exhibits no edema.       Right hand: normal sensation noted. Decreased strength noted.       Legs:      No ecchymosis or hematoma appreciated at right hip, no abrasions, skin intact.  He does have decreased strength in his right fourth and fifth  fingers which he states is baseline do to an old severe infection in his right forearm and is not worse today.  He has a 1.5 cm laceration across his volar hand proximal to the fifth finger.  There is no surrounding erythema, laceration is irregular and appears to have started to approximate.  There is no edema or drainage, no red streaking.  Wound edges appear macerated.   Neurological: He is alert.  Skin: Skin is warm and dry.  Psychiatric: He has a normal mood and affect.    ED Course  Procedures (including critical care time)  Labs Reviewed - No data to display Dg Hip Complete Right  06/16/2012  *RADIOLOGY REPORT*  Clinical Data: Fall, right hip pain  RIGHT HIP - COMPLETE 2+ VIEW  Comparison: None.  Findings: No fracture or dislocation is seen.  The bilateral hip joint spaces are symmetric.  The visualized bony pelvis appears intact.  IMPRESSION: No fracture or dislocation is seen.   Original Report Authenticated By: Charline Bills, M.D.    Dg Hand Complete Right  06/16/2012  *RADIOLOGY REPORT*  Clinical Data: Fall, cut with rusty nail along the right fifth metacarpal  RIGHT HAND - COMPLETE 3+ VIEW  Comparison: None.  Findings: No fracture or dislocation is seen.  The joint spaces are preserved.  Visualized soft tissues are grossly unremarkable.  No radiopaque foreign body is seen.  IMPRESSION: No fracture, dislocation, or radiopaque foreign body is seen.   Original Report Authenticated By: Charline Bills, M.D.      1. Laceration of right hand   2. Hip pain, right   3. Fall    LACERATION REPAIR Performed by: Burgess Amor Authorized by: Burgess Amor Consent: Verbal consent obtained. Risks and benefits: risks, benefits and alternatives were discussed Consent given by: patient Patient identity confirmed: provided demographic data Prepped and Draped in normal sterile fashion Wound explored  Laceration Location: Right hand  Laceration Length: 1.5 cm  No Foreign Bodies seen or  palpated  Anesthesia: None   Local anesthetic: None Anesthetic total: None   Irrigation method: syringe Amount of cleaning: standard  Skin closure: Steri-Strips   Number of sutures: 3   Technique: N/A   Patient tolerance: Patient tolerated the procedure well with no immediate complications.    MDM  X-rays reviewed and discussed with patient.  He was placed on Keflex for prophylactic infection given this is an old and dirty wound at increased risk for infection.  Encouraged to get rechecked for any worsened symptoms including redness swelling or drainage.    His tetanus was also updated today.    Burgess Amor, PA 06/16/12 1441  Burgess Amor, PA 06/16/12 1441

## 2012-06-16 NOTE — ED Provider Notes (Signed)
Medical screening examination/treatment/procedure(s) were performed by non-physician practitioner and as supervising physician I was immediately available for consultation/collaboration.   Joya Gaskins, MD 06/16/12 1520

## 2012-06-16 NOTE — ED Notes (Signed)
Boostrix was out of stock.  Td given

## 2012-06-16 NOTE — ED Notes (Signed)
Pt states he fell through a floor hitting his right hip. States he also got his right hand caught on a nail

## 2012-07-08 ENCOUNTER — Encounter (HOSPITAL_COMMUNITY): Payer: Self-pay | Admitting: *Deleted

## 2012-07-08 ENCOUNTER — Emergency Department (HOSPITAL_COMMUNITY)
Admission: EM | Admit: 2012-07-08 | Discharge: 2012-07-08 | Disposition: A | Discharge status: Home / Self Care | Payer: Self-pay | Attending: Emergency Medicine | Admitting: Emergency Medicine

## 2012-07-08 DIAGNOSIS — K0889 Other specified disorders of teeth and supporting structures: Secondary | ICD-10-CM

## 2012-07-08 DIAGNOSIS — Z8249 Family history of ischemic heart disease and other diseases of the circulatory system: Secondary | ICD-10-CM | POA: Insufficient documentation

## 2012-07-08 DIAGNOSIS — Z888 Allergy status to other drugs, medicaments and biological substances status: Secondary | ICD-10-CM | POA: Insufficient documentation

## 2012-07-08 DIAGNOSIS — K089 Disorder of teeth and supporting structures, unspecified: Secondary | ICD-10-CM | POA: Insufficient documentation

## 2012-07-08 DIAGNOSIS — F17200 Nicotine dependence, unspecified, uncomplicated: Secondary | ICD-10-CM | POA: Insufficient documentation

## 2012-07-08 DIAGNOSIS — Z825 Family history of asthma and other chronic lower respiratory diseases: Secondary | ICD-10-CM | POA: Insufficient documentation

## 2012-07-08 MED ORDER — AMOXICILLIN 500 MG PO CAPS: 500.0000 mg | ORAL_CAPSULE | Freq: Three times a day (TID) | ORAL | Status: DC

## 2012-07-08 MED ORDER — OXYCODONE-ACETAMINOPHEN 5-325 MG PO TABS
1.0000 | ORAL_TABLET | Freq: Once | ORAL | Status: AC
  Administered 2012-07-08: 1 via ORAL
  Filled 2012-07-08: qty 1

## 2012-07-08 MED ORDER — AMOXICILLIN 250 MG PO CAPS
500.0000 mg | ORAL_CAPSULE | Freq: Once | ORAL | Status: AC
  Administered 2012-07-08: 500 mg via ORAL
  Filled 2012-07-08: qty 2

## 2012-07-08 MED ORDER — OXYCODONE-ACETAMINOPHEN 5-325 MG PO TABS: 1.0000 | ORAL_TABLET | ORAL | Status: AC | PRN

## 2012-07-08 NOTE — ED Notes (Signed)
Dental pain x 2 days

## 2012-07-08 NOTE — ED Provider Notes (Signed)
History     CSN: [id number]  Arrival date & time 07/08/12  1322   First MD Initiated Contact with Patient 07/08/12 1416      Chief Complaint  Patient presents with  . Dental Pain    (Consider location/radiation/quality/duration/timing/severity/associated sxs/prior treatment) Patient is a 46 y.o. male presenting with tooth pain. The history is provided by the patient.  Dental PainThe primary symptoms include mouth pain. Primary symptoms do not include dental injury, oral bleeding, oral lesions, headaches, fever, shortness of breath, sore throat, angioedema or cough. The symptoms began 2 days ago. The symptoms are unchanged. The symptoms are new. The symptoms occur constantly.  Affected locations include: teeth and gum(s).  Additional symptoms include: dental sensitivity to temperature and gum tenderness. Additional symptoms do not include: gum swelling, purulent gums, trismus, jaw pain, facial swelling, trouble swallowing, pain with swallowing and ear pain. Medical issues include: smoking and periodontal disease.    Past Medical History  Diagnosis Date  . Back pain     Past Surgical History  Procedure Date  . Appendectomy   . Spinal fusion   . Arm debridement   . Skin biopsy   . Shoulder surgery   . Elbow surgery     Family History  Problem Relation Age of Onset  . Hypertension Father   . Asthma Sister     History  Substance Use Topics  . Smoking status: Current Every Day Smoker -- 0.2 packs/day    Types: Cigarettes  . Smokeless tobacco: Not on file  . Alcohol Use: No      Review of Systems  Constitutional: Negative for fever and appetite change.  HENT: Positive for dental problem. Negative for ear pain, congestion, sore throat, facial swelling, trouble swallowing, neck pain and neck stiffness.   Eyes: Negative for pain and visual disturbance.  Respiratory: Negative for cough and shortness of breath.   Neurological: Negative for dizziness, facial asymmetry and  headaches.  Hematological: Negative for adenopathy.  All other systems reviewed and are negative.    Allergies  Aspirin  Home Medications   Current Outpatient Rx  Name Route Sig Dispense Refill  . GABAPENTIN 300 MG PO CAPS Oral Take 300 mg by mouth 2 (two) times daily.    Marland Kitchen LIDOCAINE 5 % EX PTCH Transdermal Place 1 patch onto the skin daily as needed. Remove & Discard patch within 12 hours or as directed by MD. Pain      BP 134/65  Pulse 106  Temp 98.3 F (36.8 C) (Oral)  Resp 18  Ht 5\' 8"  (1.727 m)  Wt 162 lb (73.483 kg)  BMI 24.63 kg/m2  SpO2 100%  Physical Exam  Nursing note and vitals reviewed. Constitutional: He is oriented to person, place, and time. He appears well-developed and well-nourished. No distress.  HENT:  Head: Normocephalic and atraumatic. No trismus in the jaw.  Right Ear: Tympanic membrane and ear canal normal.  Left Ear: Tympanic membrane and ear canal normal.  Mouth/Throat: Uvula is midline, oropharynx is clear and moist and mucous membranes are normal. Dental caries present. No dental abscesses or uvula swelling.       Wide spread dental decay and discoloration to the remaining teeth.  No periapical abscess  Neck: Normal range of motion. Neck supple.  Cardiovascular: Normal rate, regular rhythm and normal heart sounds.   No murmur heard. Pulmonary/Chest: Effort normal and breath sounds normal.  Musculoskeletal: Normal range of motion.  Lymphadenopathy:    He has no cervical adenopathy.  Neurological: He is alert and oriented to person, place, and time. He exhibits normal muscle tone. Coordination normal.  Skin: Skin is warm and dry.    ED Course  Procedures (including critical care time)  Labs Reviewed - No data to display No results found.      MDM    Multiple dental caries and black discoloration to most of the remaining teeth.  No facial edema or trismus.  Pt agrees to close f/u with dentistry.  Given referral list.     The  patient appears reasonably screened and/or stabilized for discharge and I doubt any other medical condition or other [facility] requiring further screening, evaluation, or treatment in the ED at this time prior to discharge.   Prescribed: Amoxil Percocet #[id number]   Genoveva Singleton L. [place], [place] 07/09/12 [phone]

## 2012-07-09 NOTE — ED Provider Notes (Signed)
Medical screening examination/treatment/procedure(s) were performed by non-physician practitioner and as supervising physician I was immediately available for consultation/collaboration.  Tobin Chad, MD 07/09/12 [phone]

## 2012-08-09 ENCOUNTER — Encounter (HOSPITAL_COMMUNITY): Payer: Self-pay | Admitting: *Deleted

## 2012-08-09 ENCOUNTER — Emergency Department (HOSPITAL_COMMUNITY)
Admission: EM | Admit: 2012-08-09 | Discharge: 2012-08-09 | Disposition: A | Discharge status: Home / Self Care | Payer: Self-pay | Attending: Emergency Medicine | Admitting: Emergency Medicine

## 2012-08-09 DIAGNOSIS — M549 Dorsalgia, unspecified: Secondary | ICD-10-CM | POA: Insufficient documentation

## 2012-08-09 DIAGNOSIS — K089 Disorder of teeth and supporting structures, unspecified: Secondary | ICD-10-CM | POA: Insufficient documentation

## 2012-08-09 DIAGNOSIS — F17200 Nicotine dependence, unspecified, uncomplicated: Secondary | ICD-10-CM | POA: Insufficient documentation

## 2012-08-09 DIAGNOSIS — K029 Dental caries, unspecified: Secondary | ICD-10-CM | POA: Insufficient documentation

## 2012-08-09 DIAGNOSIS — K0889 Other specified disorders of teeth and supporting structures: Secondary | ICD-10-CM

## 2012-08-09 MED ORDER — AMOXICILLIN 250 MG PO CAPS
500.0000 mg | ORAL_CAPSULE | Freq: Once | ORAL | Status: AC
  Administered 2012-08-09: 500 mg via ORAL
  Filled 2012-08-09: qty 2

## 2012-08-09 MED ORDER — OXYCODONE-ACETAMINOPHEN 5-325 MG PO TABS
1.0000 | ORAL_TABLET | Freq: Once | ORAL | Status: AC
  Administered 2012-08-09: 1 via ORAL
  Filled 2012-08-09: qty 1

## 2012-08-09 MED ORDER — OXYCODONE-ACETAMINOPHEN 5-325 MG PO TABS: 1.0000 | ORAL_TABLET | ORAL | Status: AC | PRN

## 2012-08-09 MED ORDER — AMOXICILLIN 500 MG PO CAPS: 500.0000 mg | ORAL_CAPSULE | Freq: Three times a day (TID) | ORAL | Status: DC

## 2012-08-09 NOTE — ED Provider Notes (Signed)
History     CSN: [id number]  Arrival date & time 08/09/12  1303   None     Chief Complaint  Patient presents with  . Dental Pain    (Consider location/radiation/quality/duration/timing/severity/associated sxs/prior treatment) Patient is a 46 y.o. male presenting with tooth pain. The history is provided by the patient.  Dental PainThe primary symptoms include mouth pain. Primary symptoms do not include oral bleeding, headaches, fever, shortness of breath, sore throat, angioedema or cough. The symptoms began 3 to 5 days ago. The symptoms are worsening. The symptoms are chronic. The symptoms occur constantly.  Mouth pain occurs constantly. Mouth pain is worsening. Affected locations include: teeth and gum(s).  Additional symptoms include: dental sensitivity to temperature, gum swelling and gum tenderness. Additional symptoms do not include: purulent gums, trismus, jaw pain, facial swelling, trouble swallowing, pain with swallowing, ear pain and swollen glands. Medical issues include: smoking and periodontal disease.    Past Medical History  Diagnosis Date  . Back pain     Past Surgical History  Procedure Date  . Appendectomy   . Spinal fusion   . Arm debridement   . Skin biopsy   . Shoulder surgery   . Elbow surgery     Family History  Problem Relation Age of Onset  . Hypertension Father   . Asthma Sister     History  Substance Use Topics  . Smoking status: Current Every Day Smoker -- 0.2 packs/day    Types: Cigarettes  . Smokeless tobacco: Not on file  . Alcohol Use: No      Review of Systems  Constitutional: Negative for fever and appetite change.  HENT: Positive for dental problem. Negative for ear pain, congestion, sore throat, facial swelling, trouble swallowing, neck pain and neck stiffness.   Eyes: Negative for pain and visual disturbance.  Respiratory: Negative for cough and shortness of breath.   Neurological: Negative for dizziness, facial asymmetry  and headaches.  Hematological: Negative for adenopathy.  All other systems reviewed and are negative.    Allergies  Aspirin  Home Medications   Current Outpatient Rx  Name Route Sig Dispense Refill  . GABAPENTIN 300 MG PO CAPS Oral Take 300 mg by mouth 2 (two) times daily.    Marland Kitchen LIDOCAINE 5 % EX PTCH Transdermal Place 1 patch onto the skin daily as needed. Remove & Discard patch within 12 hours or as directed by MD. Pain      BP 130/68  Pulse 110  Temp 98.6 F (37 C) (Oral)  Resp 22  Ht 5\' 9"  (1.753 m)  Wt 155 lb (70.308 kg)  BMI 22.89 kg/m2  SpO2 100%  Physical Exam  Nursing note and vitals reviewed. Constitutional: He is oriented to person, place, and time. He appears well-developed and well-nourished. No distress.  HENT:  Head: Normocephalic and atraumatic. No trismus in the jaw.  Right Ear: Tympanic membrane and ear canal normal.  Left Ear: Tympanic membrane and ear canal normal.  Mouth/Throat: Uvula is midline, oropharynx is clear and moist and mucous membranes are normal. Dental caries present. No dental abscesses or uvula swelling.       Widespread dental decay and black discoloration to most of the remaining teeth. Tenderness to palpation along the right lower molars. Erythema is present to the surrounding gums. No obvious dental abscess  Neck: Normal range of motion. Neck supple.  Cardiovascular: Normal rate, regular rhythm and normal heart sounds.   No murmur heard. Pulmonary/Chest: Effort normal and breath sounds  normal.  Musculoskeletal: Normal range of motion.  Lymphadenopathy:    He has no cervical adenopathy.  Neurological: He is alert and oriented to person, place, and time. He exhibits normal muscle tone. Coordination normal.  Skin: Skin is warm and dry.    ED Course  Procedures (including critical care time)  Labs Reviewed - No data to display No results found.      MDM    Patient seen by me last month.  Widespread dental decay and gum  disease.  No obvious dental abscess , facial edema or trismus  Have advised patient that he will need further dental care with a dentist. He states that he [facility].  Prescribed: Amoxil Percocet #[id number]      Uel Davidow L. Keshan Reha, [place] 08/09/12 1729

## 2012-08-09 NOTE — ED Notes (Signed)
Dental pain. 

## 2012-08-09 NOTE — ED Notes (Signed)
Pt seen by EDPa for initial assessment. 

## 2012-08-11 NOTE — ED Provider Notes (Signed)
Medical screening examination/treatment/procedure(s) were performed by non-physician practitioner and as supervising physician I was immediately available for consultation/collaboration.  Jaymir Struble, MD 08/11/12 0007 

## 2012-12-13 ENCOUNTER — Emergency Department (HOSPITAL_COMMUNITY)
Admission: EM | Admit: 2012-12-13 | Discharge: 2012-12-13 | Disposition: A | Discharge status: Home / Self Care | Payer: Self-pay | Attending: Emergency Medicine | Admitting: Emergency Medicine

## 2012-12-13 ENCOUNTER — Encounter (HOSPITAL_COMMUNITY): Payer: Self-pay | Admitting: *Deleted

## 2012-12-13 DIAGNOSIS — K0889 Other specified disorders of teeth and supporting structures: Secondary | ICD-10-CM

## 2012-12-13 DIAGNOSIS — F17200 Nicotine dependence, unspecified, uncomplicated: Secondary | ICD-10-CM | POA: Insufficient documentation

## 2012-12-13 DIAGNOSIS — K089 Disorder of teeth and supporting structures, unspecified: Secondary | ICD-10-CM | POA: Insufficient documentation

## 2012-12-13 MED ORDER — OXYCODONE-ACETAMINOPHEN 5-325 MG PO TABS: 1.0000 | ORAL_TABLET | Freq: Four times a day (QID) | ORAL | Status: DC | PRN

## 2012-12-13 MED ORDER — AMOXICILLIN 250 MG PO CAPS: ORAL_CAPSULE | ORAL | Status: DC

## 2012-12-13 MED ORDER — OXYCODONE-ACETAMINOPHEN 5-325 MG PO TABS
1.0000 | ORAL_TABLET | Freq: Once | ORAL | Status: AC
  Administered 2012-12-13: 1 via ORAL
  Filled 2012-12-13: qty 1

## 2012-12-13 NOTE — ED Provider Notes (Signed)
History     CSN: [id number]  Arrival date & time 12/13/12  1300   First MD Initiated Contact with Patient 12/13/12 1451      Chief Complaint  Patient presents with  . Dental Pain     HPI  [name] Conley is a 47 y.o. male who presents to the Emergency Department complaining of 4 days of dental pain that started after the tooth broke. He reports that the pain is worse with eating and drinking. Pt states that he has tried calling medicaid and the health department but has been turned down for not meeting qualifications.   Past Medical History  Diagnosis Date  . Back pain     Past Surgical History  Procedure Laterality Date  . Appendectomy    . Spinal fusion    . Arm debridement    . Skin biopsy    . Shoulder surgery    . Elbow surgery      Family History  Problem Relation Age of Onset  . Hypertension Father   . Asthma Sister     History  Substance Use Topics  . Smoking status: Current Every Day Smoker -- 0.25 packs/day    Types: Cigarettes  . Smokeless tobacco: Not on file  . Alcohol Use: No      Review of Systems  Allergies  Aspirin  Home Medications   Current Outpatient Rx  Name  Route  Sig  Dispense  Refill  . lidocaine (LIDODERM) 5 %   Transdermal   Place 1 patch onto the skin daily as needed (for pain). Remove & Discard patch within 12 hours or as directed by MD           Triage Vitals: BP 127/82  Pulse 109  Temp(Src) 98.4 F (36.9 C) (Oral)  Resp 20  Ht 5\' 8"  (1.727 m)  Wt 160 lb (72.576 kg)  BMI 24.33 kg/m2  SpO2 97%  Physical Exam  Nursing note and vitals reviewed. Constitutional: He is oriented to person, place, and time. He appears well-developed and well-nourished. No distress.  HENT:  Head: Normocephalic and atraumatic.  Left upper molar is inflamed and tender, poor dentition  Eyes: Conjunctivae are normal.  Neck: No tracheal deviation present.  Cardiovascular: Normal rate.   No murmur heard. Pulmonary/Chest: Effort normal.   Musculoskeletal: Normal range of motion.  Neurological: He is alert and oriented to person, place, and time.  Skin: Skin is warm and dry.  Psychiatric: He has a normal mood and affect. His behavior is normal.    ED Course  Procedures (including critical care time)  DIAGNOSTIC STUDIES: Oxygen Saturation is 97% on room air, adequate by my interpretation.    COORDINATION OF CARE: 3:06 PM- Discussed discharge plan which includes pain medications and antibiotics with pt and pt agreed to plan. Also advised pt to follow up and pt agreed.  Labs Reviewed - No data to display No results found.   No diagnosis found.    MDM      The chart was scribed for me under my direct supervision.  I personally performed the history, physical, and medical decision making and all procedures in the evaluation of this patient.Benny Lennert, MD 12/13/12 [phone]

## 2012-12-13 NOTE — ED Notes (Signed)
Dental pain x 4 days. Tooth broke off and now nerve is exposed per pt.

## 2013-04-12 ENCOUNTER — Encounter (HOSPITAL_COMMUNITY): Payer: Self-pay | Admitting: Emergency Medicine

## 2013-04-12 ENCOUNTER — Emergency Department (HOSPITAL_COMMUNITY)
Admission: EM | Admit: 2013-04-12 | Discharge: 2013-04-12 | Disposition: A | Discharge status: Home / Self Care | Payer: Self-pay | Attending: Emergency Medicine | Admitting: Emergency Medicine

## 2013-04-12 ENCOUNTER — Emergency Department (HOSPITAL_COMMUNITY): Payer: Self-pay

## 2013-04-12 DIAGNOSIS — Y939 Activity, unspecified: Secondary | ICD-10-CM | POA: Insufficient documentation

## 2013-04-12 DIAGNOSIS — Y929 Unspecified place or not applicable: Secondary | ICD-10-CM | POA: Insufficient documentation

## 2013-04-12 DIAGNOSIS — K089 Disorder of teeth and supporting structures, unspecified: Secondary | ICD-10-CM | POA: Insufficient documentation

## 2013-04-12 DIAGNOSIS — K029 Dental caries, unspecified: Secondary | ICD-10-CM | POA: Insufficient documentation

## 2013-04-12 DIAGNOSIS — F17200 Nicotine dependence, unspecified, uncomplicated: Secondary | ICD-10-CM | POA: Insufficient documentation

## 2013-04-12 DIAGNOSIS — S0003XA Contusion of scalp, initial encounter: Secondary | ICD-10-CM | POA: Insufficient documentation

## 2013-04-12 DIAGNOSIS — IMO0002 Reserved for concepts with insufficient information to code with codable children: Secondary | ICD-10-CM | POA: Insufficient documentation

## 2013-04-12 DIAGNOSIS — S0083XA Contusion of other part of head, initial encounter: Secondary | ICD-10-CM

## 2013-04-12 MED ORDER — AMOXICILLIN 500 MG PO CAPS: 500.0000 mg | ORAL_CAPSULE | Freq: Three times a day (TID) | ORAL | Status: DC

## 2013-04-12 MED ORDER — NAPROXEN 375 MG PO TABS: 375.0000 mg | ORAL_TABLET | Freq: Two times a day (BID) | ORAL | Status: DC

## 2013-04-12 MED ORDER — PROMETHAZINE HCL 25 MG PO TABS: 25.0000 mg | ORAL_TABLET | Freq: Four times a day (QID) | ORAL | Status: DC | PRN

## 2013-04-12 MED ORDER — OXYCODONE-ACETAMINOPHEN 5-325 MG PO TABS
1.0000 | ORAL_TABLET | Freq: Once | ORAL | Status: AC
  Administered 2013-04-12: 1 via ORAL
  Filled 2013-04-12: qty 1

## 2013-04-12 MED ORDER — HYDROCODONE-ACETAMINOPHEN 5-325 MG PO TABS: 1.0000 | ORAL_TABLET | ORAL | Status: DC | PRN

## 2013-04-12 NOTE — ED Notes (Signed)
Advised patient of delay with CT
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
## 2013-04-13 NOTE — ED Provider Notes (Signed)
Medical screening examination/treatment/procedure(s) were performed by non-physician practitioner and as supervising physician I was immediately available for consultation/collaboration.   Amenah Tucci B. Angelo Prindle, MD 04/13/13 1634 

## 2014-02-06 ENCOUNTER — Emergency Department (HOSPITAL_COMMUNITY): Payer: Self-pay

## 2014-02-06 ENCOUNTER — Emergency Department (HOSPITAL_COMMUNITY)
Admission: EM | Admit: 2014-02-06 | Discharge: 2014-02-06 | Disposition: A | Discharge status: Home / Self Care | Payer: Self-pay | Attending: Emergency Medicine | Admitting: Emergency Medicine

## 2014-02-06 ENCOUNTER — Encounter (HOSPITAL_COMMUNITY): Payer: Self-pay | Admitting: Emergency Medicine

## 2014-02-06 DIAGNOSIS — Z87891 Personal history of nicotine dependence: Secondary | ICD-10-CM | POA: Insufficient documentation

## 2014-02-06 DIAGNOSIS — J039 Acute tonsillitis, unspecified: Secondary | ICD-10-CM | POA: Insufficient documentation

## 2014-02-06 DIAGNOSIS — J36 Peritonsillar abscess: Secondary | ICD-10-CM | POA: Insufficient documentation

## 2014-02-06 LAB — CBC WITH DIFFERENTIAL/PLATELET
BASOS ABS: 0 10*3/uL (ref 0.0–0.1)
BASOS PCT: 0 % (ref 0–1)
EOS ABS: 0 10*3/uL (ref 0.0–0.7)
EOS PCT: 0 % (ref 0–5)
HCT: 40.5 % (ref 39.0–52.0)
Hemoglobin: 14.1 g/dL (ref 13.0–17.0)
LYMPHS PCT: 11 % — AB (ref 12–46)
Lymphs Abs: 1.5 10*3/uL (ref 0.7–4.0)
MCH: 30.3 pg (ref 26.0–34.0)
MCHC: 34.8 g/dL (ref 30.0–36.0)
MCV: 86.9 fL (ref 78.0–100.0)
MONO ABS: 0.9 10*3/uL (ref 0.1–1.0)
Monocytes Relative: 7 % (ref 3–12)
Neutro Abs: 11 10*3/uL — ABNORMAL HIGH (ref 1.7–7.7)
Neutrophils Relative %: 82 % — ABNORMAL HIGH (ref 43–77)
PLATELETS: 275 10*3/uL (ref 150–400)
RBC: 4.66 MIL/uL (ref 4.22–5.81)
RDW: 13.1 % (ref 11.5–15.5)
WBC: 13.4 10*3/uL — AB (ref 4.0–10.5)

## 2014-02-06 LAB — BASIC METABOLIC PANEL
BUN: 13 mg/dL (ref 6–23)
CO2: 25 meq/L (ref 19–32)
CREATININE: 0.99 mg/dL (ref 0.50–1.35)
Calcium: 9.4 mg/dL (ref 8.4–10.5)
Chloride: 100 mEq/L (ref 96–112)
Glucose, Bld: 101 mg/dL — ABNORMAL HIGH (ref 70–99)
Potassium: 3.9 mEq/L (ref 3.7–5.3)
SODIUM: 140 meq/L (ref 137–147)

## 2014-02-06 LAB — RAPID STREP SCREEN (MED CTR MEBANE ONLY): Streptococcus, Group A Screen (Direct): NEGATIVE

## 2014-02-06 MED ORDER — OXYCODONE-ACETAMINOPHEN 5-325 MG PO TABS: 1.0000 | ORAL_TABLET | ORAL | Status: DC | PRN

## 2014-02-06 MED ORDER — MORPHINE SULFATE 4 MG/ML IJ SOLN
4.0000 mg | Freq: Once | INTRAMUSCULAR | Status: AC
Start: 2014-02-06 — End: 2014-02-06
  Administered 2014-02-06: 4 mg via INTRAVENOUS
  Filled 2014-02-06: qty 1

## 2014-02-06 MED ORDER — SODIUM CHLORIDE 0.9 % IV SOLN
Freq: Once | INTRAVENOUS | Status: AC
  Administered 2014-02-06: 14:00:00 via INTRAVENOUS

## 2014-02-06 MED ORDER — SODIUM CHLORIDE 0.9 % IV SOLN
Freq: Once | INTRAVENOUS | Status: AC
  Administered 2014-02-06: 12:00:00 via INTRAVENOUS

## 2014-02-06 MED ORDER — MORPHINE SULFATE 4 MG/ML IJ SOLN
4.0000 mg | Freq: Once | INTRAMUSCULAR | Status: AC
  Administered 2014-02-06: 4 mg via INTRAVENOUS
  Filled 2014-02-06: qty 1

## 2014-02-06 MED ORDER — IOHEXOL 300 MG/ML  SOLN
80.0000 mL | Freq: Once | INTRAMUSCULAR | Status: AC | PRN
  Administered 2014-02-06: 80 mL via INTRAVENOUS

## 2014-02-06 MED ORDER — CLINDAMYCIN PHOSPHATE 600 MG/50ML IV SOLN
600.0000 mg | Freq: Once | INTRAVENOUS | Status: AC
  Administered 2014-02-06: 600 mg via INTRAVENOUS
  Filled 2014-02-06: qty 50

## 2014-02-06 MED ORDER — CLINDAMYCIN HCL 150 MG PO CAPS: 300.0000 mg | ORAL_CAPSULE | Freq: Three times a day (TID) | ORAL | Status: DC

## 2014-02-06 MED ORDER — ONDANSETRON HCL 4 MG/2ML IJ SOLN
4.0000 mg | Freq: Once | INTRAMUSCULAR | Status: AC
  Administered 2014-02-06: 4 mg via INTRAVENOUS
  Filled 2014-02-06: qty 2

## 2014-02-06 NOTE — Discharge Instructions (Signed)

## 2014-02-06 NOTE — ED Notes (Signed)
Reports sore throat and lump in neck starting yesterday with difficulty swallowing.  Reports generalized body aches also.

## 2014-02-06 NOTE — ED Provider Notes (Signed)
CSN: [id number]     Arrival date & time 02/06/14  1012 History   First MD Initiated Contact with Patient 02/06/14 1137     Chief Complaint  Patient presents with  . Neck Pain     (Consider location/radiation/quality/duration/timing/severity/associated sxs/prior Treatment) Patient is a 48 y.o. male presenting with pharyngitis. The history is provided by the patient.  Sore Throat This is a new problem. The current episode started yesterday. The problem occurs constantly. The problem has been rapidly worsening. Associated symptoms include myalgias, nausea, neck pain, a sore throat and swollen glands. Pertinent negatives include no abdominal pain, arthralgias, chest pain, chills, congestion, coughing, fever, headaches, joint swelling, numbness, rash, vertigo, visual change, vomiting or weakness. The symptoms are aggravated by swallowing. He has tried nothing for the symptoms. The treatment provided no relief.   Patient c/o sore throat, left neck pain and difficulty swallowing that began suddenly one day prior to ED arrival.  He also reports having generalized body aches.  He denies vomiting, fever, abdominal pain, neck stiffness or headaches.    Past Medical History  Diagnosis Date  . Back pain    Past Surgical History  Procedure Laterality Date  . Appendectomy    . Spinal fusion    . Arm debridement    . Skin biopsy    . Shoulder surgery    . Elbow surgery     Family History  Problem Relation Age of Onset  . Hypertension Father   . Asthma Sister    History  Substance Use Topics  . Smoking status: Former Smoker -- 0.25 packs/day    Types: Cigarettes    Quit date: 02/06/2013  . Smokeless tobacco: Not on file  . Alcohol Use: No    Review of Systems  Constitutional: Negative for fever, chills, activity change and appetite change.  HENT: Positive for sore throat. Negative for congestion, drooling, ear pain, facial swelling, trouble swallowing and voice change.   Eyes: Negative  for pain and visual disturbance.  Respiratory: Negative for cough, chest tightness and shortness of breath.   Cardiovascular: Negative for chest pain.  Gastrointestinal: Positive for nausea. Negative for vomiting and abdominal pain.  Musculoskeletal: Positive for myalgias and neck pain. Negative for arthralgias, joint swelling and neck stiffness.  Skin: Negative for color change and rash.  Neurological: Negative for dizziness, vertigo, facial asymmetry, speech difficulty, weakness, numbness and headaches.  Hematological: Negative for adenopathy.  All other systems reviewed and are negative.     Allergies  Aspirin  Home Medications   Prior to Admission medications   Medication Sig Start Date End Date Taking? Authorizing Provider  lidocaine (LIDODERM) 5 % Place 1 patch onto the skin daily as needed (pain). Remove & Discard patch within 12 hours or as directed by MD   Yes Historical Provider, MD   BP 114/79  Pulse 91  Temp(Src) 98.6 F (37 C) (Oral)  Resp 18  Ht 5\' 8"  (1.727 m)  Wt 150 lb (68.04 kg)  BMI 22.81 kg/m2  SpO2 100% Physical Exam  Nursing note and vitals reviewed. Constitutional: He is oriented to person, place, and time. He appears well-developed and well-nourished. No distress.  HENT:  Head: Normocephalic and atraumatic.  Right Ear: Tympanic membrane and ear canal normal.  Left Ear: Tympanic membrane and ear canal normal.  Mouth/Throat: Uvula is midline and mucous membranes are normal. No trismus in the jaw. No uvula swelling. Posterior oropharyngeal edema and posterior oropharyngeal erythema present. No oropharyngeal exudate or tonsillar abscesses.  Airway patent, uvula midline, no PTA seen  Neck: Normal range of motion and phonation normal. Neck supple. No rigidity. No edema and no erythema present. No Brudzinski's sign and no Kernig's sign noted. No mass and no thyromegaly present.  Cardiovascular: Normal rate, regular rhythm, normal heart sounds and intact  distal pulses.   No murmur heard. Pulmonary/Chest: Effort normal and breath sounds normal. No respiratory distress. He exhibits no tenderness.  Abdominal: Soft. He exhibits no distension. There is no splenomegaly. There is no tenderness. There is no rebound and no guarding.  Musculoskeletal: Normal range of motion.  Lymphadenopathy:    He has cervical adenopathy.       Left cervical: Superficial cervical and deep cervical adenopathy present.  Neurological: He is alert and oriented to person, place, and time. He exhibits normal muscle tone. Coordination normal.  Skin: Skin is warm and dry.    ED Course  Procedures (including critical care time) Labs Review Labs Reviewed  CBC WITH DIFFERENTIAL - Abnormal; Notable for the following:    WBC 13.4 (*)    Neutrophils Relative % 82 (*)    Neutro Abs 11.0 (*)    Lymphocytes Relative 11 (*)    All other components within normal limits  BASIC METABOLIC PANEL - Abnormal; Notable for the following:    Glucose, Bld 101 (*)    All other components within normal limits  RAPID STREP SCREEN  CULTURE, GROUP A STREP    Imaging Review Ct Soft Tissue Neck W Contrast  02/06/2014   CLINICAL DATA:  Anterior neck swelling and dysphagia.  EXAM: CT NECK WITH CONTRAST  TECHNIQUE: Multidetector CT imaging of the neck was performed using the standard protocol following the bolus administration of intravenous contrast.  CONTRAST:  80mL OMNIPAQUE IOHEXOL 300 MG/ML  SOLN  COMPARISON:  Maxillofacial CT 04/12/2013 and cervical spine CT 05/12/2012  FINDINGS: Incidental note is made of mild cerebellar tonsillar ectopia measuring approximately 4 mm. Visualized portions of the mastoid air cells and paranasal sinuses are clear.  There is asymmetric enlargement and heterogeneity of the right palatine tonsil. A low density collection in the medial aspect of the tonsil measures 1.3 x 0.9 x 1.2 cm. An additional 5 mm collection is present slightly more anteriorly in the tonsil.  There is mild edema within the peritonsillar soft tissues. Fluid is present in the right vallecula. Left tonsil is unremarkable. Nasopharynx is unremarkable. Airway is patent, with unremarkable appearance of the larynx.  Thyroid is normal in appearance. Submandibular and parotid glands are unremarkable. Small, mildly asymmetric right level II lymph nodes measure up to 8 mm in short axis, likely reactive. There is normal intravascular enhancement. Visualized lung apices are clear. Mild disc space narrowing is present at C4-5 and C5-6.  IMPRESSION: Acute right tonsillitis with small tonsillar abscesses.  These results were called by telephone at the time of interpretation on 02/06/2014 at 1:01 PM to Dr. Deretha EmoryZackowski, who verbally acknowledged these results.   Electronically Signed   By: Sebastian AcheAllen  Grady   On: 02/06/2014 13:01     EKG Interpretation None      MDM   Final diagnoses:  Acute tonsillitis  Tonsillar abscess    Airway remains patent.  Pt handles own secretions well.  Reviewed labs and CT results, will consult ENT.  2:06 PM consulted Dr. Annalee GentaShoemaker who recommends IV clinadamycin and IVF's with 10 days of cleocin and pain medication as outpatient.  He agrees to see the patient in his office for f/u in  10 days if needed.    Pt is feeling better, received IV medications and 2L NS.  rx written for clindamycin and percocet. Pt agrees to return here for any worsening sx's and referral info given for Dr. Annalee Genta if sx's not resolving.  VSS.  Pt appears stable for d/c   Rhealyn Cullen L. Trisha Mangle, PA-C 02/07/14 2122

## 2014-02-08 LAB — CULTURE, GROUP A STREP

## 2014-02-13 NOTE — ED Provider Notes (Signed)
No att. providers found   Medical screening examination/treatment/procedure(s) were performed by non-physician practitioner and as supervising physician I was immediately available for consultation/collaboration.   EKG Interpretation None       Shelda JakesScott W. Dio Giller, MD 02/13/14 [phone]

## 2014-07-25 ENCOUNTER — Other Ambulatory Visit (HOSPITAL_COMMUNITY): Payer: Self-pay | Admitting: Family Medicine

## 2014-07-25 ENCOUNTER — Ambulatory Visit (HOSPITAL_COMMUNITY)
Admission: RE | Admit: 2014-07-25 | Discharge: 2014-07-25 | Disposition: A | Discharge status: Home / Self Care | Payer: Disability Insurance | Source: Ambulatory Visit | Attending: Family Medicine | Admitting: Family Medicine

## 2014-07-25 DIAGNOSIS — M545 Low back pain: Secondary | ICD-10-CM

## 2014-07-25 DIAGNOSIS — G8929 Other chronic pain: Secondary | ICD-10-CM | POA: Insufficient documentation

## 2014-10-16 ENCOUNTER — Emergency Department (HOSPITAL_COMMUNITY): Payer: Self-pay

## 2014-10-16 ENCOUNTER — Encounter (HOSPITAL_COMMUNITY): Payer: Self-pay | Admitting: Emergency Medicine

## 2014-10-16 ENCOUNTER — Inpatient Hospital Stay (HOSPITAL_COMMUNITY)
Admission: EM | Admit: 2014-10-16 | Discharge: 2014-10-21 | DRG: 917 | Disposition: A | Discharge status: Home / Self Care | Payer: Self-pay | Attending: Family Medicine | Admitting: Family Medicine

## 2014-10-16 DIAGNOSIS — T50901A Poisoning by unspecified drugs, medicaments and biological substances, accidental (unintentional), initial encounter: Secondary | ICD-10-CM | POA: Diagnosis present

## 2014-10-16 DIAGNOSIS — Z978 Presence of other specified devices: Secondary | ICD-10-CM

## 2014-10-16 DIAGNOSIS — F329 Major depressive disorder, single episode, unspecified: Secondary | ICD-10-CM | POA: Diagnosis present

## 2014-10-16 DIAGNOSIS — R4182 Altered mental status, unspecified: Secondary | ICD-10-CM | POA: Diagnosis present

## 2014-10-16 DIAGNOSIS — J9602 Acute respiratory failure with hypercapnia: Secondary | ICD-10-CM | POA: Diagnosis present

## 2014-10-16 DIAGNOSIS — T50904A Poisoning by unspecified drugs, medicaments and biological substances, undetermined, initial encounter: Secondary | ICD-10-CM

## 2014-10-16 DIAGNOSIS — Z9889 Other specified postprocedural states: Secondary | ICD-10-CM

## 2014-10-16 DIAGNOSIS — J690 Pneumonitis due to inhalation of food and vomit: Secondary | ICD-10-CM

## 2014-10-16 DIAGNOSIS — Z981 Arthrodesis status: Secondary | ICD-10-CM

## 2014-10-16 DIAGNOSIS — F1210 Cannabis abuse, uncomplicated: Secondary | ICD-10-CM | POA: Diagnosis present

## 2014-10-16 DIAGNOSIS — T404X1A Poisoning by other synthetic narcotics, accidental (unintentional), initial encounter: Principal | ICD-10-CM | POA: Diagnosis present

## 2014-10-16 DIAGNOSIS — E872 Acidosis: Secondary | ICD-10-CM | POA: Diagnosis present

## 2014-10-16 DIAGNOSIS — Z01818 Encounter for other preprocedural examination: Secondary | ICD-10-CM

## 2014-10-16 DIAGNOSIS — M549 Dorsalgia, unspecified: Secondary | ICD-10-CM | POA: Diagnosis present

## 2014-10-16 DIAGNOSIS — Z609 Problem related to social environment, unspecified: Secondary | ICD-10-CM | POA: Diagnosis present

## 2014-10-16 DIAGNOSIS — N179 Acute kidney failure, unspecified: Secondary | ICD-10-CM

## 2014-10-16 DIAGNOSIS — A419 Sepsis, unspecified organism: Secondary | ICD-10-CM | POA: Diagnosis present

## 2014-10-16 DIAGNOSIS — N289 Disorder of kidney and ureter, unspecified: Secondary | ICD-10-CM

## 2014-10-16 DIAGNOSIS — G8929 Other chronic pain: Secondary | ICD-10-CM | POA: Diagnosis present

## 2014-10-16 DIAGNOSIS — F1110 Opioid abuse, uncomplicated: Secondary | ICD-10-CM | POA: Insufficient documentation

## 2014-10-16 DIAGNOSIS — D72829 Elevated white blood cell count, unspecified: Secondary | ICD-10-CM | POA: Diagnosis present

## 2014-10-16 DIAGNOSIS — G9340 Encephalopathy, unspecified: Secondary | ICD-10-CM

## 2014-10-16 DIAGNOSIS — T50902D Poisoning by unspecified drugs, medicaments and biological substances, intentional self-harm, subsequent encounter: Secondary | ICD-10-CM

## 2014-10-16 DIAGNOSIS — R404 Transient alteration of awareness: Secondary | ICD-10-CM

## 2014-10-16 DIAGNOSIS — Z87891 Personal history of nicotine dependence: Secondary | ICD-10-CM

## 2014-10-16 DIAGNOSIS — Z8241 Family history of sudden cardiac death: Secondary | ICD-10-CM

## 2014-10-16 DIAGNOSIS — E875 Hyperkalemia: Secondary | ICD-10-CM | POA: Diagnosis present

## 2014-10-16 DIAGNOSIS — Z599 Problem related to housing and economic circumstances, unspecified: Secondary | ICD-10-CM

## 2014-10-16 DIAGNOSIS — G92 Toxic encephalopathy: Secondary | ICD-10-CM | POA: Diagnosis present

## 2014-10-16 DIAGNOSIS — F1310 Sedative, hypnotic or anxiolytic abuse, uncomplicated: Secondary | ICD-10-CM | POA: Diagnosis present

## 2014-10-16 LAB — BLOOD GAS, ARTERIAL
ACID-BASE EXCESS: 4.2 mmol/L — AB (ref 0.0–2.0)
Acid-Base Excess: 4.2 mmol/L — ABNORMAL HIGH (ref 0.0–2.0)
Bicarbonate: 30.1 mEq/L — ABNORMAL HIGH (ref 20.0–24.0)
Bicarbonate: 28.5 mEq/L — ABNORMAL HIGH (ref 20.0–24.0)
DRAWN BY: 38235
Drawn by: 317771
FIO2: 1 %
FIO2: 0.7 %
LHR: 20 {breaths}/min
MECHVT: 500 mL
O2 SAT: 98.9 %
O2 Saturation: 97.5 %
PCO2 ART: 45.5 mmHg — AB (ref 35.0–45.0)
PEEP/CPAP: 5 cmH2O
PEEP/CPAP: 5 cmH2O
PH ART: 7.414 (ref 7.350–7.450)
Patient temperature: 37
Patient temperature: 38.8
RATE: 14 resp/min
TCO2: 28 mmol/L (ref 0–100)
TCO2: 25.6 mmol/L (ref 0–100)
VT: 500 mL
pCO2 arterial: 63.1 mmHg (ref 35.0–45.0)
pH, Arterial: 7.3 — ABNORMAL LOW (ref 7.350–7.450)
pO2, Arterial: 194 mmHg — ABNORMAL HIGH (ref 80.0–100.0)
pO2, Arterial: 96.7 mmHg (ref 80.0–100.0)

## 2014-10-16 LAB — DIFFERENTIAL
BASOS ABS: 0 10*3/uL (ref 0.0–0.1)
Basophils Relative: 0 % (ref 0–1)
Eosinophils Absolute: 0.7 10*3/uL (ref 0.0–0.7)
Eosinophils Relative: 4 % (ref 0–5)
LYMPHS PCT: 26 % (ref 12–46)
Lymphs Abs: 4.7 10*3/uL — ABNORMAL HIGH (ref 0.7–4.0)
MONOS PCT: 5 % (ref 3–12)
Monocytes Absolute: 0.9 10*3/uL (ref 0.1–1.0)
NEUTROS ABS: 11.7 10*3/uL — AB (ref 1.7–7.7)
Neutrophils Relative %: 65 % (ref 43–77)

## 2014-10-16 LAB — CBC
HEMATOCRIT: 42.5 % (ref 39.0–52.0)
Hemoglobin: 13.4 g/dL (ref 13.0–17.0)
MCH: 30 pg (ref 26.0–34.0)
MCHC: 31.5 g/dL (ref 30.0–36.0)
MCV: 95.1 fL (ref 78.0–100.0)
PLATELETS: 361 10*3/uL (ref 150–400)
RBC: 4.47 MIL/uL (ref 4.22–5.81)
RDW: 13.4 % (ref 11.5–15.5)
WBC: 18 10*3/uL — ABNORMAL HIGH (ref 4.0–10.5)

## 2014-10-16 LAB — COMPREHENSIVE METABOLIC PANEL
ALBUMIN: 4.6 g/dL (ref 3.5–5.2)
ALT: 34 U/L (ref 0–53)
ANION GAP: 14 (ref 5–15)
AST: 32 U/L (ref 0–37)
Alkaline Phosphatase: 65 U/L (ref 39–117)
BUN: 12 mg/dL (ref 6–23)
CO2: 33 mmol/L — ABNORMAL HIGH (ref 19–32)
CREATININE: 1.62 mg/dL — AB (ref 0.50–1.35)
Calcium: 9.2 mg/dL (ref 8.4–10.5)
Chloride: 97 mEq/L (ref 96–112)
GFR calc non Af Amer: 49 mL/min — ABNORMAL LOW (ref >90)
GFR, EST AFRICAN AMERICAN: 57 mL/min — AB (ref >90)
Glucose, Bld: 320 mg/dL — ABNORMAL HIGH (ref 70–99)
POTASSIUM: 5.4 mmol/L — AB (ref 3.5–5.1)
SODIUM: 144 mmol/L (ref 135–145)
TOTAL PROTEIN: 7.2 g/dL (ref 6.0–8.3)
Total Bilirubin: 0.4 mg/dL (ref 0.3–1.2)

## 2014-10-16 LAB — URINALYSIS, ROUTINE W REFLEX MICROSCOPIC
Bilirubin Urine: NEGATIVE
GLUCOSE, UA: 500 mg/dL — AB
Hgb urine dipstick: NEGATIVE
Ketones, ur: NEGATIVE mg/dL
Leukocytes, UA: NEGATIVE
Nitrite: NEGATIVE
Protein, ur: NEGATIVE mg/dL
Specific Gravity, Urine: 1.025 (ref 1.005–1.030)
Urobilinogen, UA: 0.2 mg/dL (ref 0.0–1.0)
pH: 6 (ref 5.0–8.0)

## 2014-10-16 LAB — TROPONIN I: Troponin I: <0.03 ng/mL (ref <0.031)

## 2014-10-16 LAB — I-STAT TROPONIN, ED: TROPONIN I, POC: 0 ng/mL (ref 0.00–0.08)

## 2014-10-16 LAB — RAPID URINE DRUG SCREEN, HOSP PERFORMED
Amphetamines: NOT DETECTED
BENZODIAZEPINES: POSITIVE — AB
Barbiturates: NOT DETECTED
Cocaine: NOT DETECTED
Opiates: POSITIVE — AB
TETRAHYDROCANNABINOL: POSITIVE — AB

## 2014-10-16 LAB — APTT: aPTT: 28 seconds (ref 24–37)

## 2014-10-16 LAB — BRAIN NATRIURETIC PEPTIDE: B NATRIURETIC PEPTIDE 5: 45 pg/mL (ref 0.0–100.0)

## 2014-10-16 LAB — LACTIC ACID, PLASMA: LACTIC ACID, VENOUS: 2.4 mmol/L — AB (ref 0.5–2.2)

## 2014-10-16 LAB — PROCALCITONIN: Procalcitonin: 0.54 ng/mL

## 2014-10-16 LAB — SALICYLATE LEVEL

## 2014-10-16 LAB — I-STAT CHEM 8, ED
BUN: 14 mg/dL (ref 6–23)
CALCIUM ION: 1.14 mmol/L (ref 1.12–1.23)
CREATININE: 1.5 mg/dL — AB (ref 0.50–1.35)
Chloride: 96 mEq/L (ref 96–112)
Glucose, Bld: 316 mg/dL — ABNORMAL HIGH (ref 70–99)
HCT: 46 % (ref 39.0–52.0)
Hemoglobin: 15.6 g/dL (ref 13.0–17.0)
Potassium: 5.3 mmol/L — ABNORMAL HIGH (ref 3.5–5.1)
Sodium: 137 mmol/L (ref 135–145)
TCO2: 28 mmol/L (ref 0–100)

## 2014-10-16 LAB — ETHANOL

## 2014-10-16 LAB — AMMONIA: Ammonia: 51 umol/L — ABNORMAL HIGH (ref 11–32)

## 2014-10-16 LAB — PROTIME-INR
INR: 0.96 (ref 0.00–1.49)
Prothrombin Time: 12.9 seconds (ref 11.6–15.2)

## 2014-10-16 LAB — TRIGLYCERIDES: Triglycerides: 207 mg/dL — ABNORMAL HIGH (ref <150)

## 2014-10-16 LAB — ACETAMINOPHEN LEVEL: Acetaminophen (Tylenol), Serum: <10 ug/mL — ABNORMAL LOW (ref 10–30)

## 2014-10-16 MED ORDER — ROCURONIUM BROMIDE 50 MG/5ML IV SOLN
0.6000 mg/kg | Freq: Once | INTRAVENOUS | Status: DC
  Administered 2014-10-16: 46.3 mg via INTRAVENOUS

## 2014-10-16 MED ORDER — ONDANSETRON HCL 4 MG/2ML IJ SOLN: 4.0000 mg | Freq: Four times a day (QID) | INTRAMUSCULAR | Status: DC | PRN

## 2014-10-16 MED ORDER — LORAZEPAM 2 MG/ML IJ SOLN
2.0000 mg | Freq: Once | INTRAMUSCULAR | Status: AC
  Administered 2014-10-16: 2 mg via INTRAVENOUS
  Filled 2014-10-16: qty 1

## 2014-10-16 MED ORDER — SODIUM CHLORIDE 0.9 % IV SOLN
INTRAVENOUS | Status: DC
  Administered 2014-10-16: 23:00:00 via INTRAVENOUS

## 2014-10-16 MED ORDER — MORPHINE SULFATE 2 MG/ML IJ SOLN
2.0000 mg | Freq: Once | INTRAMUSCULAR | Status: AC
  Administered 2014-10-16: 2 mg via INTRAVENOUS
  Filled 2014-10-16: qty 1

## 2014-10-16 MED ORDER — CETYLPYRIDINIUM CHLORIDE 0.05 % MT LIQD
7.0000 mL | Freq: Four times a day (QID) | OROMUCOSAL | Status: DC
  Administered 2014-10-17 (×2): 7 mL via OROMUCOSAL

## 2014-10-16 MED ORDER — PROPOFOL 10 MG/ML IV EMUL
0.0000 ug/kg/min | INTRAVENOUS | Status: DC
  Administered 2014-10-16: 50 ug/kg/min via INTRAVENOUS
  Administered 2014-10-16: 10.808 ug/kg/min via INTRAVENOUS
  Filled 2014-10-16 (×3): qty 100

## 2014-10-16 MED ORDER — ONDANSETRON HCL 4 MG PO TABS
4.0000 mg | ORAL_TABLET | Freq: Four times a day (QID) | ORAL | Status: DC | PRN
  Administered 2014-10-18: 4 mg via ORAL
  Filled 2014-10-16: qty 1

## 2014-10-16 MED ORDER — CHLORHEXIDINE GLUCONATE 0.12 % MT SOLN
15.0000 mL | Freq: Two times a day (BID) | OROMUCOSAL | Status: DC
  Administered 2014-10-17 – 2014-10-20 (×8): 15 mL via OROMUCOSAL
  Filled 2014-10-16 (×7): qty 15

## 2014-10-16 MED ORDER — MORPHINE SULFATE 2 MG/ML IJ SOLN
2.0000 mg | INTRAMUSCULAR | Status: DC | PRN
  Administered 2014-10-17 (×2): 2 mg via INTRAVENOUS
  Filled 2014-10-16 (×2): qty 1

## 2014-10-16 MED ORDER — FENTANYL CITRATE 0.05 MG/ML IJ SOLN: 100.0000 ug | INTRAMUSCULAR | Status: DC | PRN

## 2014-10-16 MED ORDER — ETOMIDATE 2 MG/ML IV SOLN
20.0000 mg | Freq: Once | INTRAVENOUS | Status: AC
  Administered 2014-10-16: 20 mg via INTRAVENOUS

## 2014-10-16 MED ORDER — SODIUM CHLORIDE 0.9 % IV SOLN
INTRAVENOUS | Status: AC
  Administered 2014-10-16: 23:00:00 via INTRAVENOUS

## 2014-10-16 MED ORDER — ENOXAPARIN SODIUM 40 MG/0.4ML ~~LOC~~ SOLN
40.0000 mg | SUBCUTANEOUS | Status: DC
  Administered 2014-10-17 – 2014-10-20 (×5): 40 mg via SUBCUTANEOUS
  Filled 2014-10-16 (×5): qty 0.4

## 2014-10-16 MED ORDER — SUCCINYLCHOLINE CHLORIDE 20 MG/ML IJ SOLN
100.0000 mg | Freq: Once | INTRAMUSCULAR | Status: AC
  Administered 2014-10-16: 100 mg via INTRAVENOUS

## 2014-10-16 MED ORDER — IPRATROPIUM-ALBUTEROL 0.5-2.5 (3) MG/3ML IN SOLN
3.0000 mL | RESPIRATORY_TRACT | Status: DC
  Administered 2014-10-17 (×3): 3 mL via RESPIRATORY_TRACT
  Filled 2014-10-16 (×3): qty 3

## 2014-10-16 NOTE — ED Notes (Signed)
Patient arrives via EMS from home with c/o altered mental status and questionable drug overdose. Patient family [place] stating he "choked on some pills and then he hung up the phone" Patient upon EMS arrival was unresponsive, and with pinpoint pupils. Patient given 2 mg Narcan IV by EMS PTA. Patient arrives to ED in same condition EMS found him in, shivering.

## 2014-10-16 NOTE — Progress Notes (Signed)
Patient's cuff on ET tube still has a leak. MD notified. Going to wait a little while to exchange tube because patient is still somewhat awake and was a difficult intubation. Patient maintaining tidal volumes on vent at this time. Small amounts of air being added to cuff about every hour.

## 2014-10-16 NOTE — ED Notes (Signed)
Pt returned from CT °

## 2014-10-16 NOTE — ED Notes (Signed)
[name] called and states to be his sister and [facility]

## 2014-10-16 NOTE — Progress Notes (Signed)
Patient's tube exchanged with Dr. Roselyn BeringJ. Knapp and Dr. Bernell ListE. Wentz.  No problems noted and exchange went smoothly. Patient has a 7.5 ET tube at 23 at the lip. Positive chest rise, condensation and breath sounds heard. Chest xray pending to confirm re-placement.

## 2014-10-16 NOTE — ED Notes (Signed)
Jeanella FlatteryCynthia Rule, aunt, called stating she is missing 3 fentanyl patches from her home meds. States patient has made remarks in the past about cutting patches and eating them to get the drugs out. She denies any knowledge of drug abuse history. Patient was found by EMS to have Fentanyl patch in teeth upon their arrival.

## 2014-10-16 NOTE — ED Notes (Signed)
CRITICAL VALUE ALERT  Critical value received:  PCO2 63.1  Date of notification:  10/16/13  Time of notification:  19:34  Critical value read back: Yes  Nurse who received alert:  Alda LeaJennifer Tim Wilhide, RN  MD notified:  Dr. Lynelle DoctorKnapp  Time of notification:  19:35

## 2014-10-16 NOTE — ED Notes (Signed)
Spoke with pt's father, [name], after trying to reach pt's sister, [name], at [phone]. Father reports that pt's name is [name] Conley and is 49 years old. Father unable to provide birthday or health history.

## 2014-10-16 NOTE — ED Notes (Addendum)
Intubation: Dr Lynelle DoctorKnapp at bedside for intubation. Etomidate: 20 mg given IV by Leitha Bleakebecca Cockram RN 5:58 PM Succinylcholine: 100 mg given IV by Leitha Bleakebecca Cockram, RN 5:59 PM Patient intubated with 7.5 ETT 6:02 PM. Positive color change on CO2 detector, bilateral breath sounds auscultated.

## 2014-10-16 NOTE — Progress Notes (Signed)
eLink Physician-Brief Progress Note Patient Name: [name] PoserJerry Conley DOB: 12-28-1965 MRN: [id number]   Date of Service  10/16/2014  HPI/Events of Note  49 yo male with PMHx of Chronic Back pain, spinal fusion admitted to [facility] for suspected drug ingestion, found by EMS down with 3 chewed up fentanyl patches.  History per chart review. Pt called family members in [place] earlier saying he had takes some pills and hung up the phone.  They immediately call 911 who arrived at his house.  On arrival he was found down with 3 chewed up fentanyl patches at his side, one may have been actually in his mouth.  He was shivering.  He was given narcan, with no real change in his mental status.  On arrival to ED, he was shivering but with good normal vital signs.  ED physician proceeded to intubate to protect his airway.  eICU Interventions  49 yo with drug (Fentanyl patches) ingestion - monitoring vitals closely - f\u abg - monitor lytes - do not over ventilate - wean vent as tolerated     Intervention Category Evaluation Type: New Patient Evaluation  [name] 10/16/2014, 10:58 PM

## 2014-10-16 NOTE — ED Provider Notes (Signed)
CSN: [id number]     Arrival date & time 10/16/14  1754 History   First MD Initiated Contact with Patient 10/16/14 1805     Chief complaint: Altered mental status Level V caveat  The history is provided by the EMS personnel. The history is limited by the condition of the patient.   history is limited. The patient is unable to provide any history. A family member of the patient apparently called EMS from [place]. They told EMS that the patient choked on some pills. Apparently the patient hung up the phone after he told his family members that. EMS was called to the patient's house and they found him unresponsive. Fentanyl patches were found around the patient. They gave him 2 mg of Narcan IV. Following that the patient appeared to be shivering but remained unresponsive. The patient is unable to provide any history.  Past Medical History  Diagnosis Date  . Back pain    Past Surgical History  Procedure Laterality Date  . Spinal fusion    . Shoulder surgery    . Arm debridement     No family history on file. History  Substance Use Topics  . Smoking status: Former Games developer  . Smokeless tobacco: Not on file  . Alcohol Use: No    Review of Systems  Unable to perform ROS     Allergies  Aspirin  Home Medications   Prior to Admission medications   Not on File   BP 116/81 mmHg  Pulse 118  Temp(Src) 96.7 F (35.9 C) (Rectal)  Resp 20  Wt 170 lb (77.111 kg)  SpO2 94% Physical Exam  Constitutional: He has a sickly appearance. He appears distressed.  HENT:  Head: Normocephalic and atraumatic.  Right Ear: External ear normal.  Left Ear: External ear normal.  Mouth/Throat: No oropharyngeal exudate.  Clear mucus foam around his mouth   Eyes: Conjunctivae are normal. Right eye exhibits no discharge. Left eye exhibits no discharge. No scleral icterus.  Neck: No tracheal deviation present.  Cardiovascular: Regular rhythm and intact distal pulses.  Tachycardia present.    Pulmonary/Chest: Effort normal. No stridor. Tachypnea noted. No respiratory distress. He has no wheezes. He has rhonchi. He has no rales.  Abdominal: Soft. Bowel sounds are normal. He exhibits no distension and no mass. There is tenderness. There is rigidity. There is no rebound and no guarding.  Genitourinary: Penis normal. Circumcised.  Musculoskeletal: He exhibits no edema or tenderness.  No evidence of injury, extremities are stiff  Neurological: He is unresponsive. He displays tremor. Cranial nerve deficit:    He exhibits abnormal muscle tone. He displays no seizure activity. Coordination normal. GCS eye subscore is 1. GCS verbal subscore is 1. GCS motor subscore is 2.  Patient is persistently tremulous, extremities are rigid and extended  Skin: Skin is warm and dry. No rash noted.  No rashes  Psychiatric: He has a normal mood and affect.  Nursing note and vitals reviewed.   ED Course  INTUBATION Date/Time: 10/16/2014 6:15 PM Performed by: Linwood Dibbles Authorized by: Linwood Dibbles Consent: The procedure was performed in an emergent situation. Required items: required blood products, implants, devices, and special equipment available Time out: Immediately prior to procedure a "time out" was called to verify the correct patient, procedure, equipment, support staff and site/side marked as required. Indications: airway protection Intubation method: video-assisted Patient status: paralyzed (RSI) Preoxygenation: nonrebreather mask Paralytic: succinylcholine Tube type: cuffed Number of attempts: 2 Ventilation between attempts: BVM Cricoid pressure: yes Cords  visualized: yes Post-procedure assessment: chest rise and ETCO2 monitor Breath sounds: equal Cuff inflated: yes ETT to lip: 21 cm Tube secured with: ETT holder Chest x-ray interpreted by radiologist. Comments: Initial attempt cord visualized but unable to advance the ET tube.  Brief  (15 sec episode) of desat that improved with BVM.   Pt vomited after BVM.  Possible aspiration.  OP suctioned.  Rigid stylet adjusted.  Successful on 2nd attempt.    CRITICAL CARE Performed by: WUJWJ,XBJKNAPP,Shanese Riemenschneider Total critical care time: 35 Critical care time was exclusive of separately billable procedures and treating other patients. Critical care was necessary to treat or prevent imminent or life-threatening deterioration. Critical care was time spent personally by me on the following activities: development of treatment plan with patient and/or surrogate as well as nursing, discussions with consultants, evaluation of patient's response to treatment, examination of patient, obtaining history from patient or surrogate, ordering and performing treatments and interventions, ordering and review of laboratory studies, ordering and review of radiographic studies, pulse oximetry and re-evaluation of patient's condition.  Labs Review Labs Reviewed  CBC - Abnormal; Notable for the following:    WBC 18.0 (*)    All other components within normal limits  DIFFERENTIAL - Abnormal; Notable for the following:    Neutro Abs 11.7 (*)    Lymphs Abs 4.7 (*)    All other components within normal limits  COMPREHENSIVE METABOLIC PANEL - Abnormal; Notable for the following:    Potassium 5.4 (*)    CO2 33 (*)    Glucose, Bld 320 (*)    Creatinine, Ser 1.62 (*)    GFR calc non Af Amer 49 (*)    GFR calc Af Amer 57 (*)    All other components within normal limits  URINE RAPID DRUG SCREEN (HOSP PERFORMED) - Abnormal; Notable for the following:    Opiates POSITIVE (*)    Benzodiazepines POSITIVE (*)    Tetrahydrocannabinol POSITIVE (*)    All other components within normal limits  URINALYSIS, ROUTINE W REFLEX MICROSCOPIC - Abnormal; Notable for the following:    Glucose, UA 500 (*)    All other components within normal limits  ACETAMINOPHEN LEVEL - Abnormal; Notable for the following:    Acetaminophen (Tylenol), Serum <10.0 (*)    All other components within  normal limits  BLOOD GAS, ARTERIAL - Abnormal; Notable for the following:    pH, Arterial 7.300 (*)    pCO2 arterial 63.1 (*)    pO2, Arterial 194.0 (*)    Bicarbonate 30.1 (*)    Acid-Base Excess 4.2 (*)    All other components within normal limits  TRIGLYCERIDES - Abnormal; Notable for the following:    Triglycerides 207 (*)    All other components within normal limits  I-STAT CHEM 8, ED - Abnormal; Notable for the following:    Potassium 5.3 (*)    Creatinine, Ser 1.50 (*)    Glucose, Bld 316 (*)    All other components within normal limits  ETHANOL  PROTIME-INR  APTT  SALICYLATE LEVEL  AMMONIA  I-STAT TROPOININ, ED  I-STAT TROPOININ, ED    Imaging Review Ct Head Wo Contrast  10/16/2014   CLINICAL DATA:  Altered mental status.  Possible drug overdose.  EXAM: CT HEAD WITHOUT CONTRAST  CT CERVICAL SPINE WITHOUT CONTRAST  TECHNIQUE: Multidetector CT imaging of the head and cervical spine was performed following the standard protocol without intravenous contrast. Multiplanar CT image reconstructions of the cervical spine were also generated.  COMPARISON:  None.  FINDINGS: CT HEAD FINDINGS  The brain appears normal without hemorrhage, infarct, mass lesion, mass effect, midline shift or abnormal extra-axial fluid collection. No hydrocephalus or pneumocephalus. The calvarium is intact. Imaged paranasal sinuses and mastoid air cells are clear.  CT CERVICAL SPINE FINDINGS  Vertebral body height and alignment are maintained. There is some loss of disc space height and endplate spurring at C4-5. Endotracheal tube and NG tube hernia. Lung apices are clear.  IMPRESSION: No acute finding head or cervical spine.  Mild C4-5 degenerative disc disease.   Electronically Signed   By: Drusilla Kanner M.D.   On: 10/16/2014 20:11   Ct Cervical Spine Wo Contrast  10/16/2014   CLINICAL DATA:  Altered mental status.  Possible drug overdose.  EXAM: CT HEAD WITHOUT CONTRAST  CT CERVICAL SPINE WITHOUT CONTRAST   TECHNIQUE: Multidetector CT imaging of the head and cervical spine was performed following the standard protocol without intravenous contrast. Multiplanar CT image reconstructions of the cervical spine were also generated.  COMPARISON:  None.  FINDINGS: CT HEAD FINDINGS  The brain appears normal without hemorrhage, infarct, mass lesion, mass effect, midline shift or abnormal extra-axial fluid collection. No hydrocephalus or pneumocephalus. The calvarium is intact. Imaged paranasal sinuses and mastoid air cells are clear.  CT CERVICAL SPINE FINDINGS  Vertebral body height and alignment are maintained. There is some loss of disc space height and endplate spurring at C4-5. Endotracheal tube and NG tube hernia. Lung apices are clear.  IMPRESSION: No acute finding head or cervical spine.  Mild C4-5 degenerative disc disease.   Electronically Signed   By: Drusilla Kanner M.D.   On: 10/16/2014 20:11   Dg Chest Portable 1 View  10/16/2014   CLINICAL DATA:  Altered consciousness, possible drug overdose  EXAM: PORTABLE CHEST - 1 VIEW  COMPARISON:  None.  FINDINGS: Cardiomediastinal silhouette is unremarkable. No acute infiltrate or pleural effusion. No pulmonary edema. Endotracheal tube with tip 5.8 cm above the carina. NG tube in place. No pneumothorax.  IMPRESSION: No infiltrate or pulmonary edema. Endotracheal and NG tube in place. No pneumothorax.   Electronically Signed   By: Natasha Mead M.D.   On: 10/16/2014 18:26     EKG Interpretation   Date/Time:  Wednesday October 16 2014 18:04:31 EST Ventricular Rate:  112 PR Interval:  141 QRS Duration: 87 QT Interval:  335 QTC Calculation: 457 R Axis:   87 Text Interpretation:  Sinus tachycardia Borderline right axis deviation No  old tracing to compare Confirmed by Winfrey Chillemi  MD-J, Jaquilla Woodroof ([id number]) on 10/16/2014  7:47:11 PM     Notified about possible cuff leak.  RT is having to add air to the cuff however able to ventilate without difficulty.  Will continue to monitor.   Ultimately will need to exchange the tube.  Pt was a difficult intubation however.  Will proceed with the rest of the evaluation first.  Notified about ABG result.  Will increase his RR.    2030  ET tube was exchanged over a bougie.  Pt tolerated well.  Given a dose of rocuronium prior to procedure. MDM   Final diagnoses:  Altered consciousness  Endotracheally intubated  Drug overdose, undetermined intent, initial encounter   Pt with altered mental status and probable drug overdose.  Pt was found with fentanyl patches around him.  Head CT negative.  Intubated for airway protection.   Plan on hospitalization for further monitoring.    Linwood Dibbles, MD 10/16/14 2034

## 2014-10-16 NOTE — H&P (Signed)
PCP:   No primary care provider on file.   Chief Complaint:  Drug overdose  HPI: 49 yo male unknown history brought in by EMS with what appears to be drug overdose.  All history obtained from ER records and ED physician.  Pt called family members in [place] earlier saying he had takes some pills and hung up the phone.  They immediately call 911 who arrived at his house.  On arrival he was found down with 3 chewed up fentanyl patches at his side, one may have been actually in his mouth.  He was shivering.  He was given narcan, with no real change in his mental status.  On arrival to ED, he was shivering but with good normal vital signs.  Dr. Roselyn BeringJ. Knapp proceeded to intubate to protect his airway.  His ETT also had to be replaced due to the initial one leaking, repeat cxr is pending.  Pt is now sedated on propofol and intubated.  Vitals are stable and normal.  Pt arouses easily to voice.  Review of Systems:  unobtainable  Past Medical History: Past Medical History  Diagnosis Date  . Back pain    Past Surgical History  Procedure Laterality Date  . Spinal fusion    . Shoulder surgery    . Arm debridement      Medications: Prior to Admission medications   Not on File  unobtainable  Allergies:   Allergies  Allergen Reactions  . Aspirin     Gi upset     Social History: unobtainable  Family History: No family history on file. unobtainable  Physical Exam: Filed Vitals:   10/16/14 2020 10/16/14 2025 10/16/14 2035 10/16/14 2045  BP: 131/80 116/81 124/86 136/94  Pulse: 118 118 109 113  Temp:      TempSrc:      Resp: 20 20 18 20   Weight:      SpO2: 93% 94% 94% 95%   General appearance: no distress intubated, lightly sedated.  Easily arousable with voice. Head: Normocephalic, without obvious abnormality, atraumatic Eyes: negative   Nose: Nares normal. Septum midline. Mucosa normal. No drainage or sinus tenderness. Neck: no JVD and supple, symmetrical, trachea  midline Lungs: clear to auscultation bilaterally Heart: regular rate and rhythm tachycardic no m/r/g Abdomen: soft, non-tender; bowel sounds normal; no masses,  no organomegaly Extremities: extremities normal, atraumatic, no cyanosis or edema Pulses: 2+ and symmetric Skin: Skin color, texture, turgor normal. No rashes or lesions Neurologic: Cranial nerves: normal  MAE spontaneously.   Labs on Admission:   Recent Labs  10/16/14 1806 10/16/14 1824  NA 144 137  K 5.4* 5.3*  CL 97 96  CO2 33*  --   GLUCOSE 320* 316*  BUN 12 14  CREATININE 1.62* 1.50*  CALCIUM 9.2  --     Recent Labs  10/16/14 1806  AST 32  ALT 34  ALKPHOS 65  BILITOT 0.4  PROT 7.2  ALBUMIN 4.6    Recent Labs  10/16/14 1806 10/16/14 1824  WBC 18.0*  --   NEUTROABS 11.7*  --   HGB 13.4 15.6  HCT 42.5 46.0  MCV 95.1  --   PLT 361  --    Radiological Exams on Admission: Ct Head Wo Contrast  10/16/2014   CLINICAL DATA:  Altered mental status.  Possible drug overdose.  EXAM: CT HEAD WITHOUT CONTRAST  CT CERVICAL SPINE WITHOUT CONTRAST  TECHNIQUE: Multidetector CT imaging of the head and cervical spine was performed following the standard protocol  without intravenous contrast. Multiplanar CT image reconstructions of the cervical spine were also generated.  COMPARISON:  None.  FINDINGS: CT HEAD FINDINGS  The brain appears normal without hemorrhage, infarct, mass lesion, mass effect, midline shift or abnormal extra-axial fluid collection. No hydrocephalus or pneumocephalus. The calvarium is intact. Imaged paranasal sinuses and mastoid air cells are clear.  CT CERVICAL SPINE FINDINGS  Vertebral body height and alignment are maintained. There is some loss of disc space height and endplate spurring at C4-5. Endotracheal tube and NG tube hernia. Lung apices are clear.  IMPRESSION: No acute finding head or cervical spine.  Mild C4-5 degenerative disc disease.   Electronically Signed   By: Drusilla Kanner M.D.   On:  10/16/2014 20:11   Ct Cervical Spine Wo Contrast  10/16/2014   CLINICAL DATA:  Altered mental status.  Possible drug overdose.  EXAM: CT HEAD WITHOUT CONTRAST  CT CERVICAL SPINE WITHOUT CONTRAST  TECHNIQUE: Multidetector CT imaging of the head and cervical spine was performed following the standard protocol without intravenous contrast. Multiplanar CT image reconstructions of the cervical spine were also generated.  COMPARISON:  None.  FINDINGS: CT HEAD FINDINGS  The brain appears normal without hemorrhage, infarct, mass lesion, mass effect, midline shift or abnormal extra-axial fluid collection. No hydrocephalus or pneumocephalus. The calvarium is intact. Imaged paranasal sinuses and mastoid air cells are clear.  CT CERVICAL SPINE FINDINGS  Vertebral body height and alignment are maintained. There is some loss of disc space height and endplate spurring at C4-5. Endotracheal tube and NG tube hernia. Lung apices are clear.  IMPRESSION: No acute finding head or cervical spine.  Mild C4-5 degenerative disc disease.   Electronically Signed   By: Drusilla Kanner M.D.   On: 10/16/2014 20:11   Dg Chest Portable 1 View  10/16/2014   CLINICAL DATA:  Altered consciousness, possible drug overdose  EXAM: PORTABLE CHEST - 1 VIEW  COMPARISON:  None.  FINDINGS: Cardiomediastinal silhouette is unremarkable. No acute infiltrate or pleural effusion. No pulmonary edema. Endotracheal tube with tip 5.8 cm above the carina. NG tube in place. No pneumothorax.  IMPRESSION: No infiltrate or pulmonary edema. Endotracheal and NG tube in place. No pneumothorax.   Electronically Signed   By: Natasha Mead M.D.   On: 10/16/2014 18:26   Dg Chest Port 1v Same Day  10/16/2014   CLINICAL DATA:  Endotracheal tube placement.  EXAM: PORTABLE CHEST - 1 VIEW SAME DAY  COMPARISON:  Earlier the same day at 1817 hr  FINDINGS: 20:34 hr: Endotracheal tube is 4.7 cm from the carina. Enteric tube in place, tip and side port below the diaphragm in the  stomach. Developing hazy opacity in the right lung base. Increasing left perihilar opacity. The cardiomediastinal contours are normal. Question developing vascular congestion. There is widening of left acromioclavicular joint, similar to prior.  IMPRESSION: 1. Endotracheal tube 4.7 cm from the carina.  Enteric tube in place. 2. Developing hazy opacity at the right lung base and left perihilar opacity, question pleural effusion and atelectasis. Given distribution, aspiration could have a similar appearance. Question developing vascular congestion.   Electronically Signed   By: Rubye Oaks M.D.   On: 10/16/2014 20:48    Assessment/Plan  49 yo male with intentional drug overdose of fentanyl patches emergently intubated for airway protection  Principal Problem:   Drug overdose, intentional-  Drug screen positive for benzo, opiates and THC.  Per history the main culprit likely fentanyl.  ED has also received  a call from his aunt who is missing 3 of her fentanyl patches from her prescription.  Etoh, apap and asa all negative.  Continue supportive care overnight along with ventilatory support.  Should be able to start weaning him tomorrow.  Vent settings have been changed recently in ED to rate 20 from 12 i believe , 70% down from 100%, is on prvc tv of 500 peep 5.  Will repeat an abg in a couple of hours.  Cont diprovan gtt along with prn doses of morphine and ativan overnight.  Active Problems:   Leukocytosis-  ua and cxr are neg.  Repeat temp (only initial done was around 96), apply bare hugger if needed.  Close watch for developing aspiration pna.  Ck lactic acid level and procalcitonin levels, but do not suspect any infection at this time, this is most likely stress reaction, but certainly high risk for aspiriation.    Required emergent intubation-  As above. Vent support overnight.    Altered mental status-  Due to #1.    Renal insufficiency, mild-  Ivf, repeat in am.   Likely suicidal  attempt-  Unknown what circumstances are surrounding his overdose, will need to assess this after successfully extubated and prior to discharge.  Cc time 50 minutes.  FULL CODE.  Admit to icu bed.  Elery Cadenhead A 10/16/2014, 8:55 PM

## 2014-10-17 ENCOUNTER — Inpatient Hospital Stay (HOSPITAL_COMMUNITY): Payer: Self-pay

## 2014-10-17 DIAGNOSIS — N179 Acute kidney failure, unspecified: Secondary | ICD-10-CM

## 2014-10-17 DIAGNOSIS — G9340 Encephalopathy, unspecified: Secondary | ICD-10-CM

## 2014-10-17 DIAGNOSIS — J690 Pneumonitis due to inhalation of food and vomit: Secondary | ICD-10-CM

## 2014-10-17 LAB — PROTIME-INR
INR: 1.05 (ref 0.00–1.49)
Prothrombin Time: 13.8 seconds (ref 11.6–15.2)

## 2014-10-17 LAB — CBC
HCT: 36.7 % — ABNORMAL LOW (ref 39.0–52.0)
HEMOGLOBIN: 12 g/dL — AB (ref 13.0–17.0)
MCH: 29.9 pg (ref 26.0–34.0)
MCHC: 32.7 g/dL (ref 30.0–36.0)
MCV: 91.3 fL (ref 78.0–100.0)
Platelets: 200 10*3/uL (ref 150–400)
RBC: 4.02 MIL/uL — AB (ref 4.22–5.81)
RDW: 13.4 % (ref 11.5–15.5)
WBC: 8.2 10*3/uL (ref 4.0–10.5)

## 2014-10-17 LAB — COMPREHENSIVE METABOLIC PANEL
ALBUMIN: 3.3 g/dL — AB (ref 3.5–5.2)
ALK PHOS: 43 U/L (ref 39–117)
ALT: 28 U/L (ref 0–53)
AST: 35 U/L (ref 0–37)
Anion gap: 7 (ref 5–15)
BILIRUBIN TOTAL: 0.7 mg/dL (ref 0.3–1.2)
BUN: 10 mg/dL (ref 6–23)
CHLORIDE: 99 meq/L (ref 96–112)
CO2: 30 mmol/L (ref 19–32)
CREATININE: 1.23 mg/dL (ref 0.50–1.35)
Calcium: 7.6 mg/dL — ABNORMAL LOW (ref 8.4–10.5)
GFR calc Af Amer: 80 mL/min — ABNORMAL LOW (ref >90)
GFR calc non Af Amer: 69 mL/min — ABNORMAL LOW (ref >90)
GLUCOSE: 102 mg/dL — AB (ref 70–99)
POTASSIUM: 3.7 mmol/L (ref 3.5–5.1)
Sodium: 136 mmol/L (ref 135–145)
Total Protein: 5.5 g/dL — ABNORMAL LOW (ref 6.0–8.3)

## 2014-10-17 LAB — MRSA PCR SCREENING: MRSA by PCR: NEGATIVE

## 2014-10-17 LAB — TROPONIN I: Troponin I: <0.03 ng/mL (ref <0.031)

## 2014-10-17 MED ORDER — ACETAMINOPHEN 325 MG PO TABS
650.0000 mg | ORAL_TABLET | Freq: Four times a day (QID) | ORAL | Status: DC | PRN
  Administered 2014-10-17 – 2014-10-18 (×3): 650 mg via ORAL
  Filled 2014-10-17 (×4): qty 2

## 2014-10-17 MED ORDER — PIPERACILLIN-TAZOBACTAM 3.375 G IVPB
INTRAVENOUS | Status: AC
  Filled 2014-10-17: qty 50

## 2014-10-17 MED ORDER — CHLORHEXIDINE GLUCONATE 0.12 % MT SOLN: 15.0000 mL | Freq: Two times a day (BID) | OROMUCOSAL | Status: DC

## 2014-10-17 MED ORDER — PIPERACILLIN-TAZOBACTAM 3.375 G IVPB
3.3750 g | Freq: Three times a day (TID) | INTRAVENOUS | Status: DC
  Administered 2014-10-17 – 2014-10-19 (×8): 3.375 g via INTRAVENOUS
  Filled 2014-10-17 (×14): qty 50

## 2014-10-17 MED ORDER — CETYLPYRIDINIUM CHLORIDE 0.05 % MT LIQD: 7.0000 mL | Freq: Two times a day (BID) | OROMUCOSAL | Status: DC

## 2014-10-17 NOTE — Progress Notes (Signed)
PROGRESS NOTE  [name] PoserJerry Conley JYN:[id number] DOB: 08/25/1966 DOA: 10/16/2014 PCP: No primary care provider on file.  Summary: 49 year old man with unknown history who presented to the emergency department with acute encephalopathy, history of overdosing on some medications, unknown. Found with 3 chewed no patches at his side, one in his mouth. Given Narcan with no real change in status. He was intubated in the emergency department.  Assessment/Plan: 1. Intentional drug overdose. Most likely fentanyl given history. Additionally aunt is missing 3 patches. Alcohol, acetaminophen and salicylate levels were negative. Possible suicide attempt. Patient unwilling to provide further history, denies remembering details. 2. Ventilatory dependent respiratory failure. Intubated for airway protection in the emergency department. Evaluated by pulmonology this morning. Successfully extubated 1/7. 3. Aspiration pneumonia, consider sepsis given hypotension, fever, tachycardia. Lactic acid was normal, hypotension may have been secondary to propofol. Appears clinically stable on nasal cannula. 4. Acute encephalopathy. Resolved. Presumably secondary to overdose. 5. Acute renal failure. Resolved with IV fluids. 6. Urine drug screen positive for benzodiazepines, opiates and THC.   Appears quite stable status post extubation. Plan to monitor today, if remains stable consult psychiatry in the morning. Continue antibiotics for aspiration pneumonia and monitor respiratory status.  Other issues are stable, likely medically clear 1/8 with disposition as recommended by psychiatry.  Discussed with patient.  Sputum culture  Code Status: full code DVT prophylaxis: Lovenox Family Communication: none Disposition Plan: pending  Brendia Sacksaniel Tasneem Cormier, MD  [facility]  Pager [phone] If 7PM-7AM, please contact night-coverage at [email or web address], password [email or web address] 10/17/2014, 8:39 AM  LOS: 1 day    Consultants:  Pulmonology  Procedures:  Endotracheal intubation 1/6 >> 1/7  Antibiotics:  Zosyn 1/7 >>  HPI/Subjective: Had fever last night, antibiotics were started for aspiration pneumonia. Evaluated by pulmonology with recommendations for extubation.  Complains of headache. Breathing fine. No other complaints. Has no memory of yesterday's events. Denies suicidal attempt. Provide little history  Objective: Filed Vitals:   10/17/14 0745 10/17/14 0800 10/17/14 0815 10/17/14 0830  BP: 102/66 99/62 99/57  105/57  Pulse: 111 111 110 110  Temp:      TempSrc:      Resp: 16 13 13 14   Height:      Weight:      SpO2: 96% 96% 95% 96%    Intake/Output Summary (Last 24 hours) at 10/17/14 0839 Last data filed at 10/17/14 0800  Gross per 24 hour  Intake 10916.01 ml  Output   1600 ml  Net 9316.01 ml     Filed Weights   10/16/14 1751 10/16/14 2223 10/17/14 0500  Weight: 77.111 kg (170 lb) 76.8 kg (169 lb 5 oz) 80.6 kg (177 lb 11.1 oz)    Exam:     Tm 102.5, tachycardic, normotensive. Hypotensive overnight. General:  Appears calm and comfortable Eyes: PERRL, normal lids, irises ENT: grossly normal hearing, lips & tongue Cardiovascular: RRR, no m/r/g. No LE edema. Respiratory: CTA bilaterally, no w/r/r. Normal respiratory effort. Abdomen: soft, ntnd Musculoskeletal: grossly normal tone BUE/BLE Psychiatric: grossly normal mood and affect, speech fluent and appropriate Neurologic: grossly non-focal.  Data Reviewed:  ABG 7.4/45/96 last night  Creatinine 1.62 >> 1.23  chest x-ray with bilateral infiltrates suggestive of aspiration pneumonia.    Pertinent data: Admission Labs  Creatinine 1.62  ABG 7.3/63/194   ammonia 51  Lactic acid 2.4   Troponin negative  BNP 45 Imaging   1/6 CXR NAD  1/6 CT head/c-spine NAD Other  Urinalysis negative  Urine drug screen: THC, benzodiazepines, opiates  EKG ST, no acute changes  Pending data: Blood  cultures   Scheduled Meds: . sodium chloride   Intravenous STAT  . antiseptic oral rinse  7 mL Mouth Rinse QID  . antiseptic oral rinse  7 mL Mouth Rinse q12n4p  . chlorhexidine  15 mL Mouth Rinse BID  . chlorhexidine  15 mL Mouth Rinse BID  . enoxaparin (LOVENOX) injection  40 mg Subcutaneous Q24H  . ipratropium-albuterol  3 mL Nebulization Q4H  . piperacillin-tazobactam (ZOSYN)  IV  3.375 g Intravenous Q8H   Continuous Infusions: . sodium chloride 100 mL/hr at 10/17/14 0800  . propofol 30 mcg/kg/min (10/17/14 0800)    Principal Problem:   Drug overdose, intentional Active Problems:   Leukocytosis   Required emergent intubation   Altered mental status   Acute encephalopathy   Acute renal failure   Aspiration pneumonia   Time spent 25 minutes

## 2014-10-17 NOTE — Progress Notes (Signed)
ANTIBIOTIC CONSULT NOTE - INITIAL  Pharmacy Consult for Zosyn Indication: aspiration pneumonia  Allergies  Allergen Reactions  . Aspirin     Gi upset     Patient Measurements: Height: 5\' 10"  (177.8 cm) Weight: 169 lb 5 oz (76.8 kg) IBW/kg (Calculated) : 73  Vital Signs: Temp: 101.9 F (38.8 C) (01/06 2148) Temp Source: Rectal (01/06 2148) BP: 82/49 mmHg (01/06 2245) Pulse Rate: 123 (01/06 2245) Intake/Output from previous day: 01/06 0701 - 01/07 0700 In: -  Out: 1150 [Urine:1150] Intake/Output from this shift: Total I/O In: -  Out: 1150 [Urine:1150]  Labs:  Recent Labs  10/16/14 1806 10/16/14 1824  WBC 18.0*  --   HGB 13.4 15.6  PLT 361  --   CREATININE 1.62* 1.50*   Estimated Creatinine Clearance: 63.5 mL/min (by C-G formula based on Cr of 1.5). No results for input(s): VANCOTROUGH, VANCOPEAK, VANCORANDOM, GENTTROUGH, GENTPEAK, GENTRANDOM, TOBRATROUGH, TOBRAPEAK, TOBRARND, AMIKACINPEAK, AMIKACINTROU, AMIKACIN in the last 72 hours.   Microbiology: No results found for this or any previous visit (from the past 720 hour(s)).  Medical History: Past Medical History  Diagnosis Date  . Back pain     Medications:  Scheduled:  . sodium chloride   Intravenous STAT  . antiseptic oral rinse  7 mL Mouth Rinse QID  . chlorhexidine  15 mL Mouth Rinse BID  . enoxaparin (LOVENOX) injection  40 mg Subcutaneous Q24H  . ipratropium-albuterol  3 mL Nebulization Q4H  . piperacillin-tazobactam (ZOSYN)  IV  3.375 g Intravenous Q8H   Infusions:  . sodium chloride 100 mL/hr at 10/16/14 2240  . propofol 50 mcg/kg/min (10/16/14 2244)   PRN: morphine injection, ondansetron **OR** ondansetron (ZOFRAN) IV Assessment: Lean male drug overdose patient - possible aspiration pneumonia to be treated with Zosyn  Goal of Therapy:  Patient has calculated CrCl >5220ml/min so will use standard Zosyn regimen  Plan:  1.  Zosyn 3.375gm IV q8h (4hr infusions) 2. Monitor for indices of  infection and renal function  Scarlett PrestoRochette, Akeen Ledyard E 10/17/2014,1:09 AM

## 2014-10-17 NOTE — Care Management Note (Addendum)
    Page 1 of 1   10/21/2014     1:22:33 PM CARE MANAGEMENT NOTE 10/21/2014  Patient:  [name] Conley,[name]   Account Number:  [id number]  Date Initiated:  10/17/2014  Documentation initiated by:  Kathyrn SheriffHILDRESS,JESSICA  Subjective/Objective Assessment:   Pt admitted for overdose. Pt from home with self care. Pending TTS consult, [facility]. Will continue to follow for CM needs.     Action/Plan:   Anticipated DC Date:  10/19/2014   Anticipated DC Plan:  [facility]  In-house referral  Financial Counselor      DC Planning Services  CM consult      Choice offered to / List presented to:             Status of service:  Completed, signed off Medicare Important Message given?   (If response is "NO", the following Medicare IM given date fields will be blank) Date Medicare IM given:   Medicare IM given by:   Date Additional Medicare IM given:   Additional Medicare IM given by:    Discharge Disposition:  HOME/SELF CARE  Per UR Regulation:    If discussed at Long Length of Stay Meetings, dates discussed:    Comments:  10/21/14 1220 Arlyss Queenammy Aquilla Shambley, RN BSN CM Pt cleared by TTS for discharge home. Attempted to call [facility] for PCP followup. Office closed for lunch. Pt very anxious to leave so contact information given to pt to make appt. MATCH voucher given to pt. No other CM needs noted.  10/18/2014 1100 Kathyrn SheriffJessica Childress, RN, MSN, PCCN Pt expected to be cleared to have TTS consult on 10/19/2014. anticipate recommendation for inpt [facility] and discharge over weekend. No CM needs at this time.  10/17/2014 1400 Kathyrn SheriffJessica Childress, RN, MSN, [facility]

## 2014-10-17 NOTE — Clinical Social Work Psychosocial (Signed)
Clinical Social Work Department BRIEF PSYCHOSOCIAL ASSESSMENT 10/17/2014  Patient:  [name] Conley, [name] Conley     Account Number:  [id number]     Admit date:  10/16/2014  Clinical Social Worker:  Legrand Como  Date/Time:  10/17/2014 11:18 AM  Referred by:  CSW  Date Referred:  10/17/2014 Referred for  Crisis Intervention   Other Referral:   Interview type:  Patient Other interview type:    PSYCHOSOCIAL DATA Living Status:  FAMILY Admitted from facility:   Level of care:   Primary support name:  Raydon Chappuis Primary support relationship to patient:  FAMILY Degree of support available:   Patient reports that his family is supportive.    CURRENT CONCERNS Current Concerns  Behavioral Health Issues   Other Concerns:    SOCIAL WORK ASSESSMENT / PLAN CSW met with patient who was alert and oriented.  Patient indicated that he has no recollection of his overdose. He stated that he only recalls waking up in the hospital. He reported no recollection of the overdose.  Patient indicted that he resides with his mother and grandmother.  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  He indicated that he is unemployed.  Patient indicated that he takes care of his mother and grandmother and in return they take care of him. He denied any feelings of depression, apathy or hopelessness. Patient indicated that he felt fine all day yesterday.  He denied any previous S/I attempts, previous acts of self-harm or previous mental health diagnosis. Patient indicated that he did have a mental health provider nor mental health diagnosis.  He declined listing of area mental health providers/referral information.  Patient indicated "the most important thing right now is for me to get back home."  CSW discussed patient with RN, who will talk to MD for TTS order.   Assessment/plan status:  Other - See comment Other assessment/ plan:   TTS consult.   Information/referral to  community resources:    PATIENT'S/FAMILY'S RESPONSE TO PLAN OF CARE: Patient wants to go home so he can resume caring for his mother and grandmother.    Ambrose Pancoast, [place]

## 2014-10-17 NOTE — Care Management Utilization Note (Signed)
UR completed 

## 2014-10-17 NOTE — Procedures (Signed)
Extubation Procedure Note  Patient Details:   Name: [name] Conley DOB: 10-24-1965 MRN: [id number]   Airway Documentation:  Airway 7.5 mm (Active)  Secured at (cm) 23 cm 10/17/2014  6:49 AM  Measured From Nare 10/17/2014  6:49 AM  Secured Location Center 10/17/2014  6:49 AM  Secured By [employer] Tube Holder 10/17/2014  6:49 AM  Tube Holder Repositioned Yes 10/17/2014  6:49 AM  Cuff Pressure (cm H2O) 20 cm H2O 10/17/2014  6:49 AM  Site Condition Dry 10/17/2014  6:49 AM    Evaluation  O2 sats: stable throughout Complications: No apparent complications Patient did tolerate procedure well. Bilateral Breath Sounds: Diminished Suctioning: Airway Yes Extubated per Dr. Juanetta GoslingHawkins, pt tolerated well, patient on nasal can. 2 LPM, sats are 96% Noreene FilbertLasley, Irmalee Riemenschneider [place] 10/17/2014, 8:53 AM

## 2014-10-17 NOTE — Consult Note (Signed)
Consult requested by: Dr. Irene LimboGoodrich Consult requested for ventilator-dependent respiratory failure:  HPI: This is a 49 year old who had called family and told him he was taking an overdose and then hung the phone. When EMS arrived he was found lying in the floor apparently with chewed up fentanyl patches and one in his mouth. All history is from the chart as he is intubated and cannot give any history now. He has been on the ventilator overnight for airway protection and is weaning now it's not clear if he has any baseline lung disease  Past Medical History  Diagnosis Date  . Back pain      No family history on file.   History   Social History  . Marital Status: Single    Spouse Name: N/A    Number of Children: N/A  . Years of Education: N/A   Social History Main Topics  . Smoking status: Former Games developermoker  . Smokeless tobacco: None  . Alcohol Use: No  . Drug Use: None  . Sexual Activity: None   Other Topics Concern  . None   Social History Narrative  . None     ROS: Not obtainable    Objective: Vital signs in last 24 hours: Temp:  [96.7 F (35.9 C)-102.5 F (39.2 C)] 100 F (37.8 C) (01/07 0400) Pulse Rate:  [103-138] 105 (01/07 0600) Resp:  [14-26] 20 (01/07 0600) BP: (82-143)/(48-97) 88/54 mmHg (01/07 0600) SpO2:  [93 %-100 %] 100 % (01/07 0648) FiO2 (%):  [40 %-100 %] 40 % (01/07 0649) Weight:  [76.8 kg (169 lb 5 oz)-80.6 kg (177 lb 11.1 oz)] 80.6 kg (177 lb 11.1 oz) (01/07 0500) Weight change:     Intake/Output from previous day: 01/06 0701 - 01/07 0700 In: 13086.510688.2 [P.O.:9745; I.V.:943.2] Out: 1600 [Urine:1600]  PHYSICAL EXAM He is alert and responsive. He looks comfortable during the weaning process. He is motioning that he would like to have the endotracheal tube removed. His pupils react to nose and throat are clear his chest shows some rales bilaterally. His heart is regular with a mild tachycardia at about 110. His abdomen is soft without masses  extremities showed no edema central nervous system exam shows he is moving all 4 extremities and attempting to communicate  Lab Results: Basic Metabolic Panel:  Recent Labs  78/46/9600/03/26 1806 10/16/14 1824 10/17/14 0416  NA 144 137 136  K 5.4* 5.3* 3.7  CL 97 96 99  CO2 33*  --  30  GLUCOSE 320* 316* 102*  BUN 12 14 10   CREATININE 1.62* 1.50* 1.23  CALCIUM 9.2  --  7.6*   Liver Function Tests:  Recent Labs  10/16/14 1806 10/17/14 0416  AST 32 35  ALT 34 28  ALKPHOS 65 43  BILITOT 0.4 0.7  PROT 7.2 5.5*  ALBUMIN 4.6 3.3*   No results for input(s): LIPASE, AMYLASE in the last 72 hours.  Recent Labs  10/16/14 2026  AMMONIA 51*   CBC:  Recent Labs  10/16/14 1806 10/16/14 1824 10/17/14 0416  WBC 18.0*  --  8.2  NEUTROABS 11.7*  --   --   HGB 13.4 15.6 12.0*  HCT 42.5 46.0 36.7*  MCV 95.1  --  91.3  PLT 361  --  200   Cardiac Enzymes:  Recent Labs  10/16/14 2247 10/17/14 0416  TROPONINI <0.03 <0.03   BNP: No results for input(s): PROBNP in the last 72 hours. D-Dimer: No results for input(s): DDIMER in the last 72 hours.  CBG: No results for input(s): GLUCAP in the last 72 hours. Hemoglobin A1C: No results for input(s): HGBA1C in the last 72 hours. Fasting Lipid Panel:  Recent Labs  10/16/14 1814  TRIG 207*   Thyroid Function Tests: No results for input(s): TSH, T4TOTAL, FREET4, T3FREE, THYROIDAB in the last 72 hours. Anemia Panel: No results for input(s): VITAMINB12, FOLATE, FERRITIN, TIBC, IRON, RETICCTPCT in the last 72 hours. Coagulation:  Recent Labs  10/16/14 1806 10/17/14 0416  LABPROT 12.9 13.8  INR 0.96 1.05   Urine Drug Screen: Drugs of Abuse     Component Value Date/Time   LABOPIA POSITIVE* 10/16/2014 1910   COCAINSCRNUR NONE DETECTED 10/16/2014 1910   LABBENZ POSITIVE* 10/16/2014 1910   AMPHETMU NONE DETECTED 10/16/2014 1910   THCU POSITIVE* 10/16/2014 1910   LABBARB NONE DETECTED 10/16/2014 1910    Alcohol  Level:  Recent Labs  10/16/14 1806  ETH <5   Urinalysis:  Recent Labs  10/16/14 1910  COLORURINE YELLOW  LABSPEC 1.025  PHURINE 6.0  GLUCOSEU 500*  HGBUR NEGATIVE  BILIRUBINUR NEGATIVE  KETONESUR NEGATIVE  PROTEINUR NEGATIVE  UROBILINOGEN 0.2  NITRITE NEGATIVE  LEUKOCYTESUR NEGATIVE   Misc. Labs:   ABGS:  Recent Labs  10/16/14 2300  PHART 7.414  PO2ART 96.7  TCO2 25.6  HCO3 28.5*     MICROBIOLOGY: Recent Results (from the past 240 hour(s))  MRSA PCR Screening     Status: None   Collection Time: 10/16/14 10:25 PM  Result Value Ref Range Status   MRSA by PCR NEGATIVE NEGATIVE Final    Comment:        The GeneXpert MRSA Assay (FDA approved for NASAL specimens only), is one component of a comprehensive MRSA colonization surveillance program. It is not intended to diagnose MRSA infection nor to guide or monitor treatment for MRSA infections.   Culture, blood (routine x 2)     Status: None (Preliminary result)   Collection Time: 10/17/14 12:56 AM  Result Value Ref Range Status   Specimen Description BLOOD LEFT ANTECUBITAL  Final   Special Requests   Final    BOTTLES DRAWN AEROBIC AND ANAEROBIC AEB 9CC ANA 4CC   Culture PENDING  Incomplete   Report Status PENDING  Incomplete  Culture, blood (routine x 2)     Status: None (Preliminary result)   Collection Time: 10/17/14 12:56 AM  Result Value Ref Range Status   Specimen Description BLOOD LEFT HAND  Final   Special Requests   Final    BOTTLES DRAWN AEROBIC AND ANAEROBIC AEB 6CC ANA 2CC   Culture PENDING  Incomplete   Report Status PENDING  Incomplete    Studies/Results: Ct Head Wo Contrast  10/16/2014   CLINICAL DATA:  Altered mental status.  Possible drug overdose.  EXAM: CT HEAD WITHOUT CONTRAST  CT CERVICAL SPINE WITHOUT CONTRAST  TECHNIQUE: Multidetector CT imaging of the head and cervical spine was performed following the standard protocol without intravenous contrast. Multiplanar CT image  reconstructions of the cervical spine were also generated.  COMPARISON:  None.  FINDINGS: CT HEAD FINDINGS  The brain appears normal without hemorrhage, infarct, mass lesion, mass effect, midline shift or abnormal extra-axial fluid collection. No hydrocephalus or pneumocephalus. The calvarium is intact. Imaged paranasal sinuses and mastoid air cells are clear.  CT CERVICAL SPINE FINDINGS  Vertebral body height and alignment are maintained. There is some loss of disc space height and endplate spurring at C4-5. Endotracheal tube and NG tube hernia. Lung apices are clear.  IMPRESSION: No acute finding head or cervical spine.  Mild C4-5 degenerative disc disease.   Electronically Signed   By: Drusilla Kanner M.D.   On: 10/16/2014 20:11   Ct Cervical Spine Wo Contrast  10/16/2014   CLINICAL DATA:  Altered mental status.  Possible drug overdose.  EXAM: CT HEAD WITHOUT CONTRAST  CT CERVICAL SPINE WITHOUT CONTRAST  TECHNIQUE: Multidetector CT imaging of the head and cervical spine was performed following the standard protocol without intravenous contrast. Multiplanar CT image reconstructions of the cervical spine were also generated.  COMPARISON:  None.  FINDINGS: CT HEAD FINDINGS  The brain appears normal without hemorrhage, infarct, mass lesion, mass effect, midline shift or abnormal extra-axial fluid collection. No hydrocephalus or pneumocephalus. The calvarium is intact. Imaged paranasal sinuses and mastoid air cells are clear.  CT CERVICAL SPINE FINDINGS  Vertebral body height and alignment are maintained. There is some loss of disc space height and endplate spurring at C4-5. Endotracheal tube and NG tube hernia. Lung apices are clear.  IMPRESSION: No acute finding head or cervical spine.  Mild C4-5 degenerative disc disease.   Electronically Signed   By: Drusilla Kanner M.D.   On: 10/16/2014 20:11   Portable Chest 1 View  10/17/2014   CLINICAL DATA:  Intubation.  EXAM: PORTABLE CHEST - 1 VIEW  COMPARISON:   10/16/2014.  FINDINGS: Endotracheal tube and NG tube in stable position. Borderline cardiomegaly with pulmonary vascular prominence and bilateral pulmonary alveolar infiltrates. Congestive heart failure could present in this fashion. ARDS and/ or bilateral pneumonia could present in this fashion. No pleural effusion or pneumothorax. Resection of the distal left clavicle.  IMPRESSION: 1. Lines and tubes in stable position. 2. Borderline cardiomegaly.  Pulmonary venous congestion. 3. Bilateral pulmonary alveolar infiltrates again noted. Congestive heart failure pulmonary edema could present in this fashion. ARDS and/ or bilateral pneumonia could present in this fashion.   Electronically Signed   By: Maisie Fus  Register   On: 10/17/2014 07:26   Dg Chest Portable 1 View  10/16/2014   CLINICAL DATA:  Altered consciousness, possible drug overdose  EXAM: PORTABLE CHEST - 1 VIEW  COMPARISON:  None.  FINDINGS: Cardiomediastinal silhouette is unremarkable. No acute infiltrate or pleural effusion. No pulmonary edema. Endotracheal tube with tip 5.8 cm above the carina. NG tube in place. No pneumothorax.  IMPRESSION: No infiltrate or pulmonary edema. Endotracheal and NG tube in place. No pneumothorax.   Electronically Signed   By: Natasha Mead M.D.   On: 10/16/2014 18:26   Dg Chest Port 1v Same Day  10/16/2014   CLINICAL DATA:  Endotracheal tube placement.  EXAM: PORTABLE CHEST - 1 VIEW SAME DAY  COMPARISON:  Earlier the same day at 1817 hr  FINDINGS: 20:34 hr: Endotracheal tube is 4.7 cm from the carina. Enteric tube in place, tip and side port below the diaphragm in the stomach. Developing hazy opacity in the right lung base. Increasing left perihilar opacity. The cardiomediastinal contours are normal. Question developing vascular congestion. There is widening of left acromioclavicular joint, similar to prior.  IMPRESSION: 1. Endotracheal tube 4.7 cm from the carina.  Enteric tube in place. 2. Developing hazy opacity at the  right lung base and left perihilar opacity, question pleural effusion and atelectasis. Given distribution, aspiration could have a similar appearance. Question developing vascular congestion.   Electronically Signed   By: Rubye Oaks M.D.   On: 10/16/2014 20:48    Medications:  Prior to Admission:  No prescriptions prior to  admission   Scheduled: . sodium chloride   Intravenous STAT  . antiseptic oral rinse  7 mL Mouth Rinse QID  . chlorhexidine  15 mL Mouth Rinse BID  . enoxaparin (LOVENOX) injection  40 mg Subcutaneous Q24H  . ipratropium-albuterol  3 mL Nebulization Q4H  . piperacillin-tazobactam (ZOSYN)  IV  3.375 g Intravenous Q8H   Continuous: . sodium chloride 100 mL/hr at 10/17/14 0800  . propofol 30 mcg/kg/min (10/17/14 0800)   ZOX:[id number] injection, ondansetron **OR** ondansetron (ZOFRAN) IV  Assesment: This is a 49 year old with intentional drug overdose and respiratory failure related to that. His chest x-ray is consistent with aspiration and he is on appropriate treatment with Zosyn. Because of the infiltrates on chest x-ray he is at increased risk of having to be reintubated but he meets extubation criteria and actually looks very stable for extubation Principal Problem:   Drug overdose, intentional Active Problems:   Leukocytosis   Required emergent intubation   Altered mental status   Renal insufficiency, mild    Plan: Extubate today    LOS: 1 day   [name] Conley L 10/17/2014, 8:12 AM

## 2014-10-18 DIAGNOSIS — F1994 Other psychoactive substance use, unspecified with psychoactive substance-induced mood disorder: Secondary | ICD-10-CM

## 2014-10-18 DIAGNOSIS — T404X2A Poisoning by other synthetic narcotics, intentional self-harm, initial encounter: Secondary | ICD-10-CM

## 2014-10-18 DIAGNOSIS — F913 Oppositional defiant disorder: Secondary | ICD-10-CM

## 2014-10-18 LAB — PROCALCITONIN: PROCALCITONIN: 3.7 ng/mL

## 2014-10-18 MED ORDER — HYDROCODONE-ACETAMINOPHEN 5-325 MG PO TABS
1.0000 | ORAL_TABLET | Freq: Four times a day (QID) | ORAL | Status: DC | PRN
  Administered 2014-10-18 – 2014-10-21 (×8): 1 via ORAL
  Filled 2014-10-18 (×8): qty 1

## 2014-10-18 MED ORDER — MORPHINE SULFATE 2 MG/ML IJ SOLN
1.0000 mg | INTRAMUSCULAR | Status: DC | PRN
  Administered 2014-10-19: 1 mg via INTRAVENOUS
  Filled 2014-10-18: qty 1

## 2014-10-18 NOTE — Progress Notes (Signed)
Report called to 3A, patient transferred to room 335 with safety sitter Shamika.

## 2014-10-18 NOTE — Clinical Social Work Note (Signed)
CSW was notified by nurse that patient's mother had died and that patient was not aware of this.  CSW advised that TTS consult needed to occur.    CSW spoke with patient's sister, [name] Conley,offered condolences regarding loss of mother. Ms. Phillips ClimesBrame indicated that he was prepared to notify patient of his mother's death, however she felt that she needed to have law enforcement or security in the room with her as patient would likely attempt to leave the hospital. CSW discussed with Ms. Phillips ClimesBrame waiting until appropriate security measures could be taken to keep patient safe before for informing him.  Ms. Phillips ClimesBrame agreed.    [facility].  [facility]  Tretha SciaraHeather Carthel [place], [place] [phone]

## 2014-10-18 NOTE — Progress Notes (Signed)
Subjective: He says he feels okay. He is coughing up a lot of sputum. He has no other new complaints.  Objective: Vital signs in last 24 hours: Temp:  [98.5 F (36.9 C)-100.4 F (38 C)] 99.1 F (37.3 C) (01/08 0528) Pulse Rate:  [89-119] 102 (01/08 0800) Resp:  [10-31] 16 (01/08 0800) BP: (97-155)/(55-104) 139/80 mmHg (01/08 0800) SpO2:  [95 %-98 %] 97 % (01/08 0800) Weight:  [75.025 kg (165 lb 6.4 oz)] 75.025 kg (165 lb 6.4 oz) (01/08 0528) Weight change: -2.087 kg (-4 lb 9.6 oz) Last BM Date: 10/18/14  Intake/Output from previous day: 01/07 0701 - 01/08 0700 In: 1463.9 [P.O.:1200; I.V.:113.9; IV Piggyback:150] Out: 4175 [Urine:4175]  PHYSICAL EXAM General appearance: alert, cooperative and no distress Resp: rhonchi bilaterally Cardio: regular rate and rhythm, S1, S2 normal, no murmur, click, rub or gallop GI: soft, non-tender; bowel sounds normal; no masses,  no organomegaly Extremities: extremities normal, atraumatic, no cyanosis or edema  Lab Results:  Results for orders placed or performed during the hospital encounter of 10/16/14 (from the past 48 hour(s))  Ethanol     Status: None   Collection Time: 10/16/14  6:06 PM  Result Value Ref Range   Alcohol, Ethyl (B) <5 0 - 9 mg/dL    Comment:        LOWEST DETECTABLE LIMIT FOR SERUM ALCOHOL IS 11 mg/dL FOR MEDICAL PURPOSES ONLY   Protime-INR     Status: None   Collection Time: 10/16/14  6:06 PM  Result Value Ref Range   Prothrombin Time 12.9 11.6 - 15.2 seconds   INR 0.96 0.00 - 1.49  APTT     Status: None   Collection Time: 10/16/14  6:06 PM  Result Value Ref Range   aPTT 28 24 - 37 seconds  CBC     Status: Abnormal   Collection Time: 10/16/14  6:06 PM  Result Value Ref Range   WBC 18.0 (H) 4.0 - 10.5 K/uL   RBC 4.47 4.22 - 5.81 MIL/uL   Hemoglobin 13.4 13.0 - 17.0 g/dL   HCT 42.5 39.0 - 52.0 %   MCV 95.1 78.0 - 100.0 fL   MCH 30.0 26.0 - 34.0 pg   MCHC 31.5 30.0 - 36.0 g/dL   RDW 13.4 11.5 - 15.5 %   Platelets 361 150 - 400 K/uL  Differential     Status: Abnormal   Collection Time: 10/16/14  6:06 PM  Result Value Ref Range   Neutrophils Relative % 65 43 - 77 %   Neutro Abs 11.7 (H) 1.7 - 7.7 K/uL   Lymphocytes Relative 26 12 - 46 %   Lymphs Abs 4.7 (H) 0.7 - 4.0 K/uL   Monocytes Relative 5 3 - 12 %   Monocytes Absolute 0.9 0.1 - 1.0 K/uL   Eosinophils Relative 4 0 - 5 %   Eosinophils Absolute 0.7 0.0 - 0.7 K/uL   Basophils Relative 0 0 - 1 %   Basophils Absolute 0.0 0.0 - 0.1 K/uL  Comprehensive metabolic panel     Status: Abnormal   Collection Time: 10/16/14  6:06 PM  Result Value Ref Range   Sodium 144 135 - 145 mmol/L    Comment: Please note change in reference range.   Potassium 5.4 (H) 3.5 - 5.1 mmol/L    Comment: Please note change in reference range.   Chloride 97 96 - 112 mEq/L   CO2 33 (H) 19 - 32 mmol/L   Glucose, Bld 320 (H) 70 -  99 mg/dL   BUN 12 6 - 23 mg/dL   Creatinine, Ser 1.62 (H) 0.50 - 1.35 mg/dL   Calcium 9.2 8.4 - 10.5 mg/dL   Total Protein 7.2 6.0 - 8.3 g/dL   Albumin 4.6 3.5 - 5.2 g/dL   AST 32 0 - 37 U/L   ALT 34 0 - 53 U/L   Alkaline Phosphatase 65 39 - 117 U/L   Total Bilirubin 0.4 0.3 - 1.2 mg/dL   GFR calc non Af Amer 49 (L) >90 mL/min   GFR calc Af Amer 57 (L) >90 mL/min    Comment: (NOTE) The eGFR has been calculated using the CKD EPI equation. This calculation has not been validated in all clinical situations. eGFR's persistently <90 mL/min signify possible Chronic Kidney Disease.    Anion gap 14 5 - 15  Salicylate level     Status: None   Collection Time: 10/16/14  6:06 PM  Result Value Ref Range   Salicylate Lvl <2.0 2.8 - 20.0 mg/dL  Acetaminophen level     Status: Abnormal   Collection Time: 10/16/14  6:06 PM  Result Value Ref Range   Acetaminophen (Tylenol), Serum <10.0 (L) 10 - 30 ug/mL    Comment:        THERAPEUTIC CONCENTRATIONS VARY SIGNIFICANTLY. A RANGE OF 10-30 ug/mL MAY BE AN EFFECTIVE CONCENTRATION FOR MANY  PATIENTS. HOWEVER, SOME ARE BEST TREATED AT CONCENTRATIONS OUTSIDE THIS RANGE. ACETAMINOPHEN CONCENTRATIONS >150 ug/mL AT 4 HOURS AFTER INGESTION AND >50 ug/mL AT 12 HOURS AFTER INGESTION ARE OFTEN ASSOCIATED WITH TOXIC REACTIONS.   Triglycerides     Status: Abnormal   Collection Time: 10/16/14  6:14 PM  Result Value Ref Range   Triglycerides 207 (H) <150 mg/dL  I-Stat Troponin, ED (not at [facility])     Status: None   Collection Time: 10/16/14  6:17 PM  Result Value Ref Range   Troponin i, poc 0.00 0.00 - 0.08 ng/mL   Comment 3            Comment: Due to the release kinetics of cTnI, a negative result within the first hours of the onset of symptoms does not rule out myocardial infarction with certainty. If myocardial infarction is still suspected, repeat the test at appropriate intervals.   I-Stat Chem 8, ED     Status: Abnormal   Collection Time: 10/16/14  6:24 PM  Result Value Ref Range   Sodium 137 135 - 145 mmol/L   Potassium 5.3 (H) 3.5 - 5.1 mmol/L   Chloride 96 96 - 112 mEq/L   BUN 14 6 - 23 mg/dL   Creatinine, Ser 1.50 (H) 0.50 - 1.35 mg/dL   Glucose, Bld 316 (H) 70 - 99 mg/dL   Calcium, Ion 1.14 1.12 - 1.23 mmol/L   TCO2 28 0 - 100 mmol/L   Hemoglobin 15.6 13.0 - 17.0 g/dL   HCT 46.0 39.0 - 52.0 %  Urine Drug Screen     Status: Abnormal   Collection Time: 10/16/14  7:10 PM  Result Value Ref Range   Opiates POSITIVE (A) NONE DETECTED   Cocaine NONE DETECTED NONE DETECTED   Benzodiazepines POSITIVE (A) NONE DETECTED   Amphetamines NONE DETECTED NONE DETECTED   Tetrahydrocannabinol POSITIVE (A) NONE DETECTED   Barbiturates NONE DETECTED NONE DETECTED    Comment:        DRUG SCREEN FOR MEDICAL PURPOSES ONLY.  IF CONFIRMATION IS NEEDED FOR ANY PURPOSE, NOTIFY LAB WITHIN 5 DAYS.  LOWEST DETECTABLE LIMITS FOR URINE DRUG SCREEN Drug Class       Cutoff (ng/mL) Amphetamine      1000 Barbiturate      200 Benzodiazepine   007 Tricyclics       121 Opiates           300 Cocaine          300 THC              50   Urinalysis, Routine w reflex microscopic     Status: Abnormal   Collection Time: 10/16/14  7:10 PM  Result Value Ref Range   Color, Urine YELLOW YELLOW   APPearance CLEAR CLEAR   Specific Gravity, Urine 1.025 1.005 - 1.030   pH 6.0 5.0 - 8.0   Glucose, UA 500 (A) NEGATIVE mg/dL   Hgb urine dipstick NEGATIVE NEGATIVE   Bilirubin Urine NEGATIVE NEGATIVE   Ketones, ur NEGATIVE NEGATIVE mg/dL   Protein, ur NEGATIVE NEGATIVE mg/dL   Urobilinogen, UA 0.2 0.0 - 1.0 mg/dL   Nitrite NEGATIVE NEGATIVE   Leukocytes, UA NEGATIVE NEGATIVE    Comment: MICROSCOPIC NOT DONE ON URINES WITH NEGATIVE PROTEIN, BLOOD, LEUKOCYTES, NITRITE, OR GLUCOSE <1000 mg/dL.  Blood gas, arterial ([facility] & [facility] ONLY)     Status: Abnormal   Collection Time: 10/16/14  7:28 PM  Result Value Ref Range   FIO2 1.00 %   Delivery systems VENTILATOR    Mode PRESSURE REGULATED VOLUME CONTROL    VT 500 mL   Rate 14 resp/min   Peep/cpap 5.0 cm H20   pH, Arterial 7.300 (L) 7.350 - 7.450   pCO2 arterial 63.1 (HH) 35.0 - 45.0 mmHg    Comment: CRITICAL RESULT CALLED TO, READ BACK BY AND VERIFIED WITH: J.HEADLEY,RN BY B.STOPHEL,RRT,RCP ON 10/16/14 AT 19:34.    pO2, Arterial 194.0 (H) 80.0 - 100.0 mmHg   Bicarbonate 30.1 (H) 20.0 - 24.0 mEq/L   TCO2 28.0 0 - 100 mmol/L   Acid-Base Excess 4.2 (H) 0.0 - 2.0 mmol/L   O2 Saturation 98.9 %   Patient temperature 37.0    Collection site RIGHT RADIAL    Drawn by [phone]    Sample type ARTERIAL DRAW    Allens test (pass/fail) PASS PASS  Ammonia     Status: Abnormal   Collection Time: 10/16/14  8:26 PM  Result Value Ref Range   Ammonia 51 (H) 11 - 32 umol/L    Comment: Please note change in reference range.  Lactic acid, plasma     Status: Abnormal   Collection Time: 10/16/14  9:19 PM  Result Value Ref Range   Lactic Acid, Venous 2.4 (H) 0.5 - 2.2 mmol/L  Procalcitonin - Baseline     Status: None   Collection Time: 10/16/14  9:19 PM   Result Value Ref Range   Procalcitonin 0.54 ng/mL    Comment:        Interpretation: PCT > 0.5 ng/mL and <= 2 ng/mL: Systemic infection (sepsis) is possible, but other conditions are known to elevate PCT as well. (NOTE)         ICU PCT Algorithm               Non ICU PCT Algorithm    ----------------------------     ------------------------------         PCT < 0.25 ng/mL                 PCT < 0.1 ng/mL  Stopping of antibiotics            Stopping of antibiotics       strongly encouraged.               strongly encouraged.    ----------------------------     ------------------------------       PCT level decrease by               PCT < 0.25 ng/mL       >= 80% from peak PCT       OR PCT 0.25 - 0.5 ng/mL          Stopping of antibiotics                                             encouraged.     Stopping of antibiotics           encouraged.    ----------------------------     ------------------------------       PCT level decrease by              PCT >= 0.25 ng/mL       < 80% from peak PCT        AND PCT >= 0.5 ng/mL             Continuing antibiotics                                              encouraged.       Continuing antibiotics            encouraged.    ----------------------------     ------------------------------     PCT level increase compared          PCT > 0.5 ng/mL         with peak PCT AND          PCT >= 0.5 ng/mL             Escalation of antibiotics                                          strongly encouraged.      Escalation of antibiotics        strongly encouraged.   MRSA PCR Screening     Status: None   Collection Time: 10/16/14 10:25 PM  Result Value Ref Range   MRSA by PCR NEGATIVE NEGATIVE    Comment:        The GeneXpert MRSA Assay (FDA approved for NASAL specimens only), is one component of a comprehensive MRSA colonization surveillance program. It is not intended to diagnose MRSA infection nor to guide or monitor treatment for MRSA  infections.   Troponin I     Status: None   Collection Time: 10/16/14 10:47 PM  Result Value Ref Range   Troponin I <0.03 <0.031 ng/mL    Comment:        NO INDICATION OF MYOCARDIAL INJURY. Please note change in reference range.   Brain natriuretic peptide     Status: None   Collection Time: 10/16/14 10:47 PM  Result Value Ref Range  B Natriuretic Peptide 45.0 0.0 - 100.0 pg/mL    Comment: Please note change in reference range.  Blood gas, arterial     Status: Abnormal   Collection Time: 10/16/14 11:00 PM  Result Value Ref Range   FIO2 0.70 %   Delivery systems VENTILATOR    Mode PRESSURE REGULATED VOLUME CONTROL    VT 500 mL   Rate 20.0 resp/min   Peep/cpap 5.0 cm H20   pH, Arterial 7.414 7.350 - 7.450   pCO2 arterial 45.5 (H) 35.0 - 45.0 mmHg   pO2, Arterial 96.7 80.0 - 100.0 mmHg   Bicarbonate 28.5 (H) 20.0 - 24.0 mEq/L   TCO2 25.6 0 - 100 mmol/L   Acid-Base Excess 4.2 (H) 0.0 - 2.0 mmol/L   O2 Saturation 97.5 %   Patient temperature 38.8    Collection site RIGHT RADIAL    Drawn by [phone]    Sample type ARTERIAL DRAW    Allens test (pass/fail) PASS PASS  Culture, blood (routine x 2)     Status: None (Preliminary result)   Collection Time: 10/17/14 12:56 AM  Result Value Ref Range   Specimen Description BLOOD LEFT ANTECUBITAL    Special Requests      BOTTLES DRAWN AEROBIC AND ANAEROBIC AEB=9CC ANA=4CC   Culture NO GROWTH 1 DAY    Report Status PENDING   Culture, blood (routine x 2)     Status: None (Preliminary result)   Collection Time: 10/17/14 12:56 AM  Result Value Ref Range   Specimen Description BLOOD LEFT HAND    Special Requests      BOTTLES DRAWN AEROBIC AND ANAEROBIC AEB=6CC ANA=2CC   Culture NO GROWTH 1 DAY    Report Status PENDING   Troponin I     Status: None   Collection Time: 10/17/14  4:16 AM  Result Value Ref Range   Troponin I <0.03 <0.031 ng/mL    Comment:        NO INDICATION OF MYOCARDIAL INJURY. Please note change in reference  range.   Comprehensive metabolic panel     Status: Abnormal   Collection Time: 10/17/14  4:16 AM  Result Value Ref Range   Sodium 136 135 - 145 mmol/L    Comment: Please note change in reference range.   Potassium 3.7 3.5 - 5.1 mmol/L    Comment: DELTA CHECK NOTED Please note change in reference range.    Chloride 99 96 - 112 mEq/L   CO2 30 19 - 32 mmol/L   Glucose, Bld 102 (H) 70 - 99 mg/dL   BUN 10 6 - 23 mg/dL   Creatinine, Ser 1.23 0.50 - 1.35 mg/dL   Calcium 7.6 (L) 8.4 - 10.5 mg/dL   Total Protein 5.5 (L) 6.0 - 8.3 g/dL   Albumin 3.3 (L) 3.5 - 5.2 g/dL   AST 35 0 - 37 U/L   ALT 28 0 - 53 U/L   Alkaline Phosphatase 43 39 - 117 U/L   Total Bilirubin 0.7 0.3 - 1.2 mg/dL   GFR calc non Af Amer 69 (L) >90 mL/min   GFR calc Af Amer 80 (L) >90 mL/min    Comment: (NOTE) The eGFR has been calculated using the CKD EPI equation. This calculation has not been validated in all clinical situations. eGFR's persistently <90 mL/min signify possible Chronic Kidney Disease.    Anion gap 7 5 - 15  CBC     Status: Abnormal   Collection Time: 10/17/14  4:16 AM  Result Value  Ref Range   WBC 8.2 4.0 - 10.5 K/uL   RBC 4.02 (L) 4.22 - 5.81 MIL/uL   Hemoglobin 12.0 (L) 13.0 - 17.0 g/dL    Comment: DELTA CHECK NOTED RESULT REPEATED AND VERIFIED    HCT 36.7 (L) 39.0 - 52.0 %   MCV 91.3 78.0 - 100.0 fL   MCH 29.9 26.0 - 34.0 pg   MCHC 32.7 30.0 - 36.0 g/dL   RDW 13.4 11.5 - 15.5 %   Platelets 200 150 - 400 K/uL    Comment: DELTA CHECK NOTED  Protime-INR     Status: None   Collection Time: 10/17/14  4:16 AM  Result Value Ref Range   Prothrombin Time 13.8 11.6 - 15.2 seconds   INR 1.05 0.00 - 1.49  Procalcitonin     Status: None   Collection Time: 10/18/14  4:53 AM  Result Value Ref Range   Procalcitonin 3.70 ng/mL    Comment:        Interpretation: PCT > 2 ng/mL: Systemic infection (sepsis) is likely, unless other causes are known. (NOTE)         ICU PCT Algorithm                Non ICU PCT Algorithm    ----------------------------     ------------------------------         PCT < 0.25 ng/mL                 PCT < 0.1 ng/mL     Stopping of antibiotics            Stopping of antibiotics       strongly encouraged.               strongly encouraged.    ----------------------------     ------------------------------       PCT level decrease by               PCT < 0.25 ng/mL       >= 80% from peak PCT       OR PCT 0.25 - 0.5 ng/mL          Stopping of antibiotics                                             encouraged.     Stopping of antibiotics           encouraged.    ----------------------------     ------------------------------       PCT level decrease by              PCT >= 0.25 ng/mL       < 80% from peak PCT        AND PCT >= 0.5 ng/mL            Continuing antibiotics                                               encouraged.       Continuing antibiotics            encouraged.    ----------------------------     ------------------------------     PCT level increase compared  PCT > 0.5 ng/mL         with peak PCT AND          PCT >= 0.5 ng/mL             Escalation of antibiotics                                          strongly encouraged.      Escalation of antibiotics        strongly encouraged.     ABGS  Recent Labs  10/16/14 2300  PHART 7.414  PO2ART 96.7  TCO2 25.6  HCO3 28.5*   CULTURES Recent Results (from the past 240 hour(s))  MRSA PCR Screening     Status: None   Collection Time: 10/16/14 10:25 PM  Result Value Ref Range Status   MRSA by PCR NEGATIVE NEGATIVE Final    Comment:        The GeneXpert MRSA Assay (FDA approved for NASAL specimens only), is one component of a comprehensive MRSA colonization surveillance program. It is not intended to diagnose MRSA infection nor to guide or monitor treatment for MRSA infections.   Culture, blood (routine x 2)     Status: None (Preliminary result)   Collection Time:  10/17/14 12:56 AM  Result Value Ref Range Status   Specimen Description BLOOD LEFT ANTECUBITAL  Final   Special Requests   Final    BOTTLES DRAWN AEROBIC AND ANAEROBIC AEB=9CC ANA=4CC   Culture NO GROWTH 1 DAY  Final   Report Status PENDING  Incomplete  Culture, blood (routine x 2)     Status: None (Preliminary result)   Collection Time: 10/17/14 12:56 AM  Result Value Ref Range Status   Specimen Description BLOOD LEFT HAND  Final   Special Requests   Final    BOTTLES DRAWN AEROBIC AND ANAEROBIC AEB=6CC ANA=2CC   Culture NO GROWTH 1 DAY  Final   Report Status PENDING  Incomplete   Studies/Results: Ct Head Wo Contrast  10/16/2014   CLINICAL DATA:  Altered mental status.  Possible drug overdose.  EXAM: CT HEAD WITHOUT CONTRAST  CT CERVICAL SPINE WITHOUT CONTRAST  TECHNIQUE: Multidetector CT imaging of the head and cervical spine was performed following the standard protocol without intravenous contrast. Multiplanar CT image reconstructions of the cervical spine were also generated.  COMPARISON:  None.  FINDINGS: CT HEAD FINDINGS  The brain appears normal without hemorrhage, infarct, mass lesion, mass effect, midline shift or abnormal extra-axial fluid collection. No hydrocephalus or pneumocephalus. The calvarium is intact. Imaged paranasal sinuses and mastoid air cells are clear.  CT CERVICAL SPINE FINDINGS  Vertebral body height and alignment are maintained. There is some loss of disc space height and endplate spurring at E7-2. Endotracheal tube and NG tube hernia. Lung apices are clear.  IMPRESSION: No acute finding head or cervical spine.  Mild C4-5 degenerative disc disease.   Electronically Signed   By: Inge Rise M.D.   On: 10/16/2014 20:11   Ct Cervical Spine Wo Contrast  10/16/2014   CLINICAL DATA:  Altered mental status.  Possible drug overdose.  EXAM: CT HEAD WITHOUT CONTRAST  CT CERVICAL SPINE WITHOUT CONTRAST  TECHNIQUE: Multidetector CT imaging of the head and cervical spine was  performed following the standard protocol without intravenous contrast. Multiplanar CT image reconstructions of the cervical spine were also generated.  COMPARISON:  None.  FINDINGS: CT HEAD FINDINGS  The brain appears normal without hemorrhage, infarct, mass lesion, mass effect, midline shift or abnormal extra-axial fluid collection. No hydrocephalus or pneumocephalus. The calvarium is intact. Imaged paranasal sinuses and mastoid air cells are clear.  CT CERVICAL SPINE FINDINGS  Vertebral body height and alignment are maintained. There is some loss of disc space height and endplate spurring at L2-7. Endotracheal tube and NG tube hernia. Lung apices are clear.  IMPRESSION: No acute finding head or cervical spine.  Mild C4-5 degenerative disc disease.   Electronically Signed   By: Inge Rise M.D.   On: 10/16/2014 20:11   Portable Chest 1 View  10/17/2014   CLINICAL DATA:  Intubation.  EXAM: PORTABLE CHEST - 1 VIEW  COMPARISON:  10/16/2014.  FINDINGS: Endotracheal tube and NG tube in stable position. Borderline cardiomegaly with pulmonary vascular prominence and bilateral pulmonary alveolar infiltrates. Congestive heart failure could present in this fashion. ARDS and/ or bilateral pneumonia could present in this fashion. No pleural effusion or pneumothorax. Resection of the distal left clavicle.  IMPRESSION: 1. Lines and tubes in stable position. 2. Borderline cardiomegaly.  Pulmonary venous congestion. 3. Bilateral pulmonary alveolar infiltrates again noted. Congestive heart failure pulmonary edema could present in this fashion. ARDS and/ or bilateral pneumonia could present in this fashion.   Electronically Signed   By: Marcello Moores  Register   On: 10/17/2014 07:26   Dg Chest Portable 1 View  10/16/2014   CLINICAL DATA:  Altered consciousness, possible drug overdose  EXAM: PORTABLE CHEST - 1 VIEW  COMPARISON:  None.  FINDINGS: Cardiomediastinal silhouette is unremarkable. No acute infiltrate or pleural effusion.  No pulmonary edema. Endotracheal tube with tip 5.8 cm above the carina. NG tube in place. No pneumothorax.  IMPRESSION: No infiltrate or pulmonary edema. Endotracheal and NG tube in place. No pneumothorax.   Electronically Signed   By: Lahoma Crocker M.D.   On: 10/16/2014 18:26   Dg Chest Port 1v Same Day  10/16/2014   CLINICAL DATA:  Endotracheal tube placement.  EXAM: PORTABLE CHEST - 1 VIEW SAME DAY  COMPARISON:  Earlier the same day at 1817 hr  FINDINGS: 20:34 hr: Endotracheal tube is 4.7 cm from the carina. Enteric tube in place, tip and side port below the diaphragm in the stomach. Developing hazy opacity in the right lung base. Increasing left perihilar opacity. The cardiomediastinal contours are normal. Question developing vascular congestion. There is widening of left acromioclavicular joint, similar to prior.  IMPRESSION: 1. Endotracheal tube 4.7 cm from the carina.  Enteric tube in place. 2. Developing hazy opacity at the right lung base and left perihilar opacity, question pleural effusion and atelectasis. Given distribution, aspiration could have a similar appearance. Question developing vascular congestion.   Electronically Signed   By: Jeb Levering M.D.   On: 10/16/2014 20:48    Medications:  Prior to Admission:  No prescriptions prior to admission   Scheduled: . chlorhexidine  15 mL Mouth Rinse BID  . enoxaparin (LOVENOX) injection  40 mg Subcutaneous Q24H  . piperacillin-tazobactam (ZOSYN)  IV  3.375 g Intravenous Q8H   Continuous:  [id number], ondansetron **OR** ondansetron (ZOFRAN) IV  Assesment: He was admitted with acute respiratory failure because of an intentional drug overdose. He was able to be extubated yesterday. He has what appears to be aspiration pneumonia. He is generally improved and says he does not have any lung disease that he knows of Principal Problem:   Drug overdose, intentional Active  Problems:   Leukocytosis   Required emergent intubation    Altered mental status   Acute encephalopathy   Acute renal failure   Aspiration pneumonia    Plan: Continue antibiotics. He is also requiring oxygen at this point. But his oxygen saturation is pretty good so that could potentially be weaned    LOS: 2 days   Erna Brossard L 10/18/2014, 8:23 AM

## 2014-10-18 NOTE — Progress Notes (Signed)
Room air O2 sat 97-99%. Patient weaned from oxygen.

## 2014-10-18 NOTE — Progress Notes (Signed)
PROGRESS NOTE  [name] PoserJerry Conley ZOX:[id number] DOB: Nov 12, 1965 DOA: 10/16/2014 PCP: No primary care provider on file.  Summary: 49 year old man with unknown history who presented to the emergency department with acute encephalopathy, history of overdosing on some medications, unknown. Found with 3 chewed no patches at his side, one in his mouth. Given Narcan with no real change in status. He was intubated in the emergency department.  Assessment/Plan: 1. Presumed intentional drug overdose. Fentanyl by history. Alcohol, acetaminophen and salicylate levels were negative on admission. Presumed suicide attempt. Patient denies any recollection of events. 2. Aspiration pneumonia, suspected sepsis on admission with hypotension, fever and tachycardia. Lactic acid was normal. Hypotension resolved. Appears stable on nasal cannula oxygen. 3. Acute encephalopathy, resolved. Secondary to overdose. 4. Acute renal failure. Resolved with IV fluids. 5. Status post intubation and mechanical ventilation for airway protection. 6. Urine drug screen positive for benzodiazepines, opiates and THC.   Continues to improve. Tolerating diet. Main issues hypoxia. Expect he will be able to be weaned off oxygen in the next 24 hours. Continue IV antibiotics today, likely transition to oral 1/9 and placed psychiatry consultation at that time  Transfer to medical floor  Code Status: full code DVT prophylaxis: Lovenox Family Communication: none Disposition Plan: pending  Brendia Sacksaniel Goodrich, MD  [facility]  Pager [phone] If 7PM-7AM, please contact night-coverage at [email or web address], password [email or web address] 10/18/2014, 9:52 AM  LOS: 2 days   Consultants:  Pulmonology  Procedures:  Endotracheal intubation 1/6 >> 1/7  Antibiotics:  Zosyn 1/7 >>  HPI/Subjective: Extubated yesterday 1/7.  Feeling better. Breathing well. No shortness of breath. No nausea or vomiting. Eating well. Continues to report amnesia in regard to events  prior to admission.  Objective: Filed Vitals:   10/18/14 0528 10/18/14 0600 10/18/14 0700 10/18/14 0800  BP:  112/67 155/104 139/80  Pulse:  110 112 102  Temp: 99.1 F (37.3 C)     TempSrc: Oral     Resp:  14 31 16   Height:      Weight: 75.025 kg (165 lb 6.4 oz)     SpO2:  98% 98% 97%    Intake/Output Summary (Last 24 hours) at 10/18/14 0952 Last data filed at 10/18/14 0931  Gross per 24 hour  Intake   1450 ml  Output   4425 ml  Net  -2975 ml     Filed Weights   10/16/14 2223 10/17/14 0500 10/18/14 0528  Weight: 76.8 kg (169 lb 5 oz) 80.6 kg (177 lb 11.1 oz) 75.025 kg (165 lb 6.4 oz)    Exam:     Tm 100.4, minimal tachycardia, normotensive. SPO2 stable on 2 L nasal cannula. General:  Appears calm, comfortable Cardiovascular: RRR, no m/r/g. No LE edema. Respiratory: CTA bilaterally, no w/r/r. Normal respiratory effort. Psychiatric: grossly normal mood and affect, speech fluent and appropriate  Data Reviewed:  Urine output 4175    Pertinent data: Admission Labs  Creatinine 1.62 >> 1.23  ABG 7.3/63/194   ammonia 51  Lactic acid 2.4   Troponin negative  BNP 45 Imaging   1/6 CXR NAD  1/6 CT head/c-spine NAD Other  Urinalysis negative  Urine drug screen: THC, benzodiazepines, opiates  EKG ST, no acute changes  Pending data: Blood cultures no growth to date. Sputum culture ordered.  Scheduled Meds: . chlorhexidine  15 mL Mouth Rinse BID  . enoxaparin (LOVENOX) injection  40 mg Subcutaneous Q24H  . piperacillin-tazobactam (ZOSYN)  IV  3.375 g Intravenous Q8H   Continuous Infusions:  Principal Problem:   Drug overdose, intentional Active Problems:   Leukocytosis   Required emergent intubation   Altered mental status   Acute encephalopathy   Acute renal failure   Aspiration pneumonia   Time spent 20 minutes

## 2014-10-18 NOTE — Consult Note (Signed)
Telepsych Consultation   Reason for Consult:  Overdose on Fentanyl Referring Physician:  Dr. Sarajane Jews [facility]) [name] Conley is an 49 y.o. male.  Assessment: AXIS I:  Substance Abuse and Substance Induced Mood Disorder, MDD, Overdose AXIS II:  Deferred AXIS III:   Past Medical History  Diagnosis Date  . Back pain    AXIS IV:  economic problems, occupational problems, other psychosocial or environmental problems, problems related to social environment and problems with primary support group AXIS V:  11-20 some danger of hurting self or others possible OR occasionally fails to maintain minimal personal hygiene OR gross impairment in communication  Plan:  Recommend psychiatric Inpatient admission when medically cleared.  Subjective:   [name] Conley is a 49 y.o. male patient admitted with reports that he was found shaking and nearly unresponsive with fentanyl patches in his mouth and chewed-up fentanyl patches near his body. Pt has reported to internal medicine staff that he has no recollection of these events and he denies suicidal ideation to them. During assessment with this NP, pt denies SI, HI, and AVH. Pt does not appear to be responding to internal stimuli and he appears to possess good insight. His memory appears to be intact, which is in stark contrast to his reports that he has no memory whatsoever of the events leading to his hospital stay from fentanyl overdose. He reports that he "normally chews 1/2 a patch every 3 days so I have no idea why there were so many near me". Pt presents with a very flat affect and does not appear to be concerned about the situation at all, which is consistent with reported presentation during assessment with Dr. Sarajane Jews, his attending hospitalist. Additionally, when asked about Fentanyl potency, pt affirms that he knows it is very potent and he has been "using it for years," which portrays a deep understanding of the dangers of high dosing in the amount of 6 times  the patient's reported regular usage. Pt reports that "I'm living for my mom who is sick, I'm all she's got, so I have to be here to take care of her" as a reason that he would not harm himself. However, pt's mother was found dead this morning; per family request, family is to come to hospital to inform the pt personally about her apparent cardiac arrest. It is concerning that pt may become even more of a risk for self-harm when he is informed of his mother's death.   In collaboration with this NP, Dr. Parke Poisson (Psychiatrist), and Dr. Sarajane Jews, it is determined that the inconsistencies in subjective statements by patient vs.objective findings of the 4 chewed fentanyl patches are a reason for serious concern and reason to believe that pt did attempt to commit suicide intentionally. Psychiatry is recommending inpatient psychiatric hospitalization for safety and stabilization and the implementation of IVC papers (Involuntary Commitment) given that pt wants to leave now and may leave freely in the absence of IVC when he is informed of his mother's passing.   HPI:  23 year old man with unknown history who presented to the emergency department with acute encephalopathy, history of overdosing on some medications, unknown. Found with 3 chewed no patches at his side, one in his mouth. Given Narcan with no real change in status. He was intubated in the emergency department.   HPI Elements:   Location:  Psychiatric. Quality:  Stable / Severe. Severity:  Severe. Timing:  Intermittent. Duration:  Transient. Context:  Exacerbation of underlying substance abuse, substance induced mood disorder,  and likley MDD (although pt denies), with attempted overdose.  Past Psychiatric History: Past Medical History  Diagnosis Date  . Back pain     reports that he has quit smoking. He does not have any smokeless tobacco history on file. He reports that he does not drink alcohol. His drug history is not on file. No family  history on file.       Allergies:   Allergies  Allergen Reactions  . Aspirin     Gi upset     ACT Assessment Complete:  Yes:    Educational Status    Risk to Self: Risk to self with the past 6 months Is patient at risk for suicide?: Yes Substance abuse history and/or treatment for substance abuse?: Yes  Risk to Others:    Abuse:    Prior Inpatient Therapy:    Prior Outpatient Therapy:    Additional Information:                    Objective: Blood pressure 111/71, pulse 101, temperature 98.3 F (36.8 C), temperature source Oral, resp. rate 12, height '5\' 10"'  (1.778 m), weight 75.025 kg (165 lb 6.4 oz), SpO2 97 %.Body mass index is 23.73 kg/(m^2). Results for orders placed or performed during the hospital encounter of 10/16/14 (from the past 72 hour(s))  Ethanol     Status: None   Collection Time: 10/16/14  6:06 PM  Result Value Ref Range   Alcohol, Ethyl (B) <5 0 - 9 mg/dL    Comment:        LOWEST DETECTABLE LIMIT FOR SERUM ALCOHOL IS 11 mg/dL FOR MEDICAL PURPOSES ONLY   Protime-INR     Status: None   Collection Time: 10/16/14  6:06 PM  Result Value Ref Range   Prothrombin Time 12.9 11.6 - 15.2 seconds   INR 0.96 0.00 - 1.49  APTT     Status: None   Collection Time: 10/16/14  6:06 PM  Result Value Ref Range   aPTT 28 24 - 37 seconds  CBC     Status: Abnormal   Collection Time: 10/16/14  6:06 PM  Result Value Ref Range   WBC 18.0 (H) 4.0 - 10.5 K/uL   RBC 4.47 4.22 - 5.81 MIL/uL   Hemoglobin 13.4 13.0 - 17.0 g/dL   HCT 42.5 39.0 - 52.0 %   MCV 95.1 78.0 - 100.0 fL   MCH 30.0 26.0 - 34.0 pg   MCHC 31.5 30.0 - 36.0 g/dL   RDW 13.4 11.5 - 15.5 %   Platelets 361 150 - 400 K/uL  Differential     Status: Abnormal   Collection Time: 10/16/14  6:06 PM  Result Value Ref Range   Neutrophils Relative % 65 43 - 77 %   Neutro Abs 11.7 (H) 1.7 - 7.7 K/uL   Lymphocytes Relative 26 12 - 46 %   Lymphs Abs 4.7 (H) 0.7 - 4.0 K/uL   Monocytes Relative 5 3 - 12  %   Monocytes Absolute 0.9 0.1 - 1.0 K/uL   Eosinophils Relative 4 0 - 5 %   Eosinophils Absolute 0.7 0.0 - 0.7 K/uL   Basophils Relative 0 0 - 1 %   Basophils Absolute 0.0 0.0 - 0.1 K/uL  Comprehensive metabolic panel     Status: Abnormal   Collection Time: 10/16/14  6:06 PM  Result Value Ref Range   Sodium 144 135 - 145 mmol/L    Comment: Please note change in reference range.  Potassium 5.4 (H) 3.5 - 5.1 mmol/L    Comment: Please note change in reference range.   Chloride 97 96 - 112 mEq/L   CO2 33 (H) 19 - 32 mmol/L   Glucose, Bld 320 (H) 70 - 99 mg/dL   BUN 12 6 - 23 mg/dL   Creatinine, Ser 1.62 (H) 0.50 - 1.35 mg/dL   Calcium 9.2 8.4 - 10.5 mg/dL   Total Protein 7.2 6.0 - 8.3 g/dL   Albumin 4.6 3.5 - 5.2 g/dL   AST 32 0 - 37 U/L   ALT 34 0 - 53 U/L   Alkaline Phosphatase 65 39 - 117 U/L   Total Bilirubin 0.4 0.3 - 1.2 mg/dL   GFR calc non Af Amer 49 (L) >90 mL/min   GFR calc Af Amer 57 (L) >90 mL/min    Comment: (NOTE) The eGFR has been calculated using the CKD EPI equation. This calculation has not been validated in all clinical situations. eGFR's persistently <90 mL/min signify possible Chronic Kidney Disease.    Anion gap 14 5 - 15  Salicylate level     Status: None   Collection Time: 10/16/14  6:06 PM  Result Value Ref Range   Salicylate Lvl <8.3 2.8 - 20.0 mg/dL  Acetaminophen level     Status: Abnormal   Collection Time: 10/16/14  6:06 PM  Result Value Ref Range   Acetaminophen (Tylenol), Serum <10.0 (L) 10 - 30 ug/mL    Comment:        THERAPEUTIC CONCENTRATIONS VARY SIGNIFICANTLY. A RANGE OF 10-30 ug/mL MAY BE AN EFFECTIVE CONCENTRATION FOR MANY PATIENTS. HOWEVER, SOME ARE BEST TREATED AT CONCENTRATIONS OUTSIDE THIS RANGE. ACETAMINOPHEN CONCENTRATIONS >150 ug/mL AT 4 HOURS AFTER INGESTION AND >50 ug/mL AT 12 HOURS AFTER INGESTION ARE OFTEN ASSOCIATED WITH TOXIC REACTIONS.   Triglycerides     Status: Abnormal   Collection Time: 10/16/14  6:14 PM   Result Value Ref Range   Triglycerides 207 (H) <150 mg/dL  I-Stat Troponin, ED (not at [facility])     Status: None   Collection Time: 10/16/14  6:17 PM  Result Value Ref Range   Troponin i, poc 0.00 0.00 - 0.08 ng/mL   Comment 3            Comment: Due to the release kinetics of cTnI, a negative result within the first hours of the onset of symptoms does not rule out myocardial infarction with certainty. If myocardial infarction is still suspected, repeat the test at appropriate intervals.   I-Stat Chem 8, ED     Status: Abnormal   Collection Time: 10/16/14  6:24 PM  Result Value Ref Range   Sodium 137 135 - 145 mmol/L   Potassium 5.3 (H) 3.5 - 5.1 mmol/L   Chloride 96 96 - 112 mEq/L   BUN 14 6 - 23 mg/dL   Creatinine, Ser 1.50 (H) 0.50 - 1.35 mg/dL   Glucose, Bld 316 (H) 70 - 99 mg/dL   Calcium, Ion 1.14 1.12 - 1.23 mmol/L   TCO2 28 0 - 100 mmol/L   Hemoglobin 15.6 13.0 - 17.0 g/dL   HCT 46.0 39.0 - 52.0 %  Urine Drug Screen     Status: Abnormal   Collection Time: 10/16/14  7:10 PM  Result Value Ref Range   Opiates POSITIVE (A) NONE DETECTED   Cocaine NONE DETECTED NONE DETECTED   Benzodiazepines POSITIVE (A) NONE DETECTED   Amphetamines NONE DETECTED NONE DETECTED   Tetrahydrocannabinol POSITIVE (A) NONE DETECTED  Barbiturates NONE DETECTED NONE DETECTED    Comment:        DRUG SCREEN FOR MEDICAL PURPOSES ONLY.  IF CONFIRMATION IS NEEDED FOR ANY PURPOSE, NOTIFY LAB WITHIN 5 DAYS.        LOWEST DETECTABLE LIMITS FOR URINE DRUG SCREEN Drug Class       Cutoff (ng/mL) Amphetamine      1000 Barbiturate      200 Benzodiazepine   956 Tricyclics       213 Opiates          300 Cocaine          300 THC              50   Urinalysis, Routine w reflex microscopic     Status: Abnormal   Collection Time: 10/16/14  7:10 PM  Result Value Ref Range   Color, Urine YELLOW YELLOW   APPearance CLEAR CLEAR   Specific Gravity, Urine 1.025 1.005 - 1.030   pH 6.0 5.0 - 8.0   Glucose,  UA 500 (A) NEGATIVE mg/dL   Hgb urine dipstick NEGATIVE NEGATIVE   Bilirubin Urine NEGATIVE NEGATIVE   Ketones, ur NEGATIVE NEGATIVE mg/dL   Protein, ur NEGATIVE NEGATIVE mg/dL   Urobilinogen, UA 0.2 0.0 - 1.0 mg/dL   Nitrite NEGATIVE NEGATIVE   Leukocytes, UA NEGATIVE NEGATIVE    Comment: MICROSCOPIC NOT DONE ON URINES WITH NEGATIVE PROTEIN, BLOOD, LEUKOCYTES, NITRITE, OR GLUCOSE <1000 mg/dL.  Blood gas, arterial ([facility] & [facility] ONLY)     Status: Abnormal   Collection Time: 10/16/14  7:28 PM  Result Value Ref Range   FIO2 1.00 %   Delivery systems VENTILATOR    Mode PRESSURE REGULATED VOLUME CONTROL    VT 500 mL   Rate 14 resp/min   Peep/cpap 5.0 cm H20   pH, Arterial 7.300 (L) 7.350 - 7.450   pCO2 arterial 63.1 (HH) 35.0 - 45.0 mmHg    Comment: CRITICAL RESULT CALLED TO, READ BACK BY AND VERIFIED WITH: J.HEADLEY,RN BY B.STOPHEL,RRT,RCP ON 10/16/14 AT 19:34.    pO2, Arterial 194.0 (H) 80.0 - 100.0 mmHg   Bicarbonate 30.1 (H) 20.0 - 24.0 mEq/L   TCO2 28.0 0 - 100 mmol/L   Acid-Base Excess 4.2 (H) 0.0 - 2.0 mmol/L   O2 Saturation 98.9 %   Patient temperature 37.0    Collection site RIGHT RADIAL    Drawn by [id number]    Sample type ARTERIAL DRAW    Allens test (pass/fail) PASS PASS  Ammonia     Status: Abnormal   Collection Time: 10/16/14  8:26 PM  Result Value Ref Range   Ammonia 51 (H) 11 - 32 umol/L    Comment: Please note change in reference range.  Lactic acid, plasma     Status: Abnormal   Collection Time: 10/16/14  9:19 PM  Result Value Ref Range   Lactic Acid, Venous 2.4 (H) 0.5 - 2.2 mmol/L  Procalcitonin - Baseline     Status: None   Collection Time: 10/16/14  9:19 PM  Result Value Ref Range   Procalcitonin 0.54 ng/mL    Comment:        Interpretation: PCT > 0.5 ng/mL and <= 2 ng/mL: Systemic infection (sepsis) is possible, but other conditions are known to elevate PCT as well. (NOTE)         ICU PCT Algorithm               Non ICU PCT Algorithm     ----------------------------     ------------------------------  PCT < 0.25 ng/mL                 PCT < 0.1 ng/mL     Stopping of antibiotics            Stopping of antibiotics       strongly encouraged.               strongly encouraged.    ----------------------------     ------------------------------       PCT level decrease by               PCT < 0.25 ng/mL       >= 80% from peak PCT       OR PCT 0.25 - 0.5 ng/mL          Stopping of antibiotics                                             encouraged.     Stopping of antibiotics           encouraged.    ----------------------------     ------------------------------       PCT level decrease by              PCT >= 0.25 ng/mL       < 80% from peak PCT        AND PCT >= 0.5 ng/mL             Continuing antibiotics                                              encouraged.       Continuing antibiotics            encouraged.    ----------------------------     ------------------------------     PCT level increase compared          PCT > 0.5 ng/mL         with peak PCT AND          PCT >= 0.5 ng/mL             Escalation of antibiotics                                          strongly encouraged.      Escalation of antibiotics        strongly encouraged.   MRSA PCR Screening     Status: None   Collection Time: 10/16/14 10:25 PM  Result Value Ref Range   MRSA by PCR NEGATIVE NEGATIVE    Comment:        The GeneXpert MRSA Assay (FDA approved for NASAL specimens only), is one component of a comprehensive MRSA colonization surveillance program. It is not intended to diagnose MRSA infection nor to guide or monitor treatment for MRSA infections.   Troponin I     Status: None   Collection Time: 10/16/14 10:47 PM  Result Value Ref Range   Troponin I <0.03 <0.031 ng/mL    Comment:        NO INDICATION OF MYOCARDIAL INJURY. Please note change in  reference range.   Brain natriuretic peptide     Status: None   Collection  Time: 10/16/14 10:47 PM  Result Value Ref Range   B Natriuretic Peptide 45.0 0.0 - 100.0 pg/mL    Comment: Please note change in reference range.  Blood gas, arterial     Status: Abnormal   Collection Time: 10/16/14 11:00 PM  Result Value Ref Range   FIO2 0.70 %   Delivery systems VENTILATOR    Mode PRESSURE REGULATED VOLUME CONTROL    VT 500 mL   Rate 20.0 resp/min   Peep/cpap 5.0 cm H20   pH, Arterial 7.414 7.350 - 7.450   pCO2 arterial 45.5 (H) 35.0 - 45.0 mmHg   pO2, Arterial 96.7 80.0 - 100.0 mmHg   Bicarbonate 28.5 (H) 20.0 - 24.0 mEq/L   TCO2 25.6 0 - 100 mmol/L   Acid-Base Excess 4.2 (H) 0.0 - 2.0 mmol/L   O2 Saturation 97.5 %   Patient temperature 38.8    Collection site RIGHT RADIAL    Drawn by [id number]    Sample type ARTERIAL DRAW    Allens test (pass/fail) PASS PASS  Culture, blood (routine x 2)     Status: None (Preliminary result)   Collection Time: 10/17/14 12:56 AM  Result Value Ref Range   Specimen Description BLOOD LEFT ANTECUBITAL    Special Requests      BOTTLES DRAWN AEROBIC AND ANAEROBIC AEB=9CC ANA=4CC   Culture NO GROWTH 1 DAY    Report Status PENDING   Culture, blood (routine x 2)     Status: None (Preliminary result)   Collection Time: 10/17/14 12:56 AM  Result Value Ref Range   Specimen Description BLOOD LEFT HAND    Special Requests      BOTTLES DRAWN AEROBIC AND ANAEROBIC AEB=6CC ANA=2CC   Culture NO GROWTH 1 DAY    Report Status PENDING   Troponin I     Status: None   Collection Time: 10/17/14  4:16 AM  Result Value Ref Range   Troponin I <0.03 <0.031 ng/mL    Comment:        NO INDICATION OF MYOCARDIAL INJURY. Please note change in reference range.   Comprehensive metabolic panel     Status: Abnormal   Collection Time: 10/17/14  4:16 AM  Result Value Ref Range   Sodium 136 135 - 145 mmol/L    Comment: Please note change in reference range.   Potassium 3.7 3.5 - 5.1 mmol/L    Comment: DELTA CHECK NOTED Please note change in  reference range.    Chloride 99 96 - 112 mEq/L   CO2 30 19 - 32 mmol/L   Glucose, Bld 102 (H) 70 - 99 mg/dL   BUN 10 6 - 23 mg/dL   Creatinine, Ser 1.23 0.50 - 1.35 mg/dL   Calcium 7.6 (L) 8.4 - 10.5 mg/dL   Total Protein 5.5 (L) 6.0 - 8.3 g/dL   Albumin 3.3 (L) 3.5 - 5.2 g/dL   AST 35 0 - 37 U/L   ALT 28 0 - 53 U/L   Alkaline Phosphatase 43 39 - 117 U/L   Total Bilirubin 0.7 0.3 - 1.2 mg/dL   GFR calc non Af Amer 69 (L) >90 mL/min   GFR calc Af Amer 80 (L) >90 mL/min    Comment: (NOTE) The eGFR has been calculated using the CKD EPI equation. This calculation has not been validated in all clinical situations. eGFR's persistently <90 mL/min signify possible Chronic Kidney Disease.  Anion gap 7 5 - 15  CBC     Status: Abnormal   Collection Time: 10/17/14  4:16 AM  Result Value Ref Range   WBC 8.2 4.0 - 10.5 K/uL   RBC 4.02 (L) 4.22 - 5.81 MIL/uL   Hemoglobin 12.0 (L) 13.0 - 17.0 g/dL    Comment: DELTA CHECK NOTED RESULT REPEATED AND VERIFIED    HCT 36.7 (L) 39.0 - 52.0 %   MCV 91.3 78.0 - 100.0 fL   MCH 29.9 26.0 - 34.0 pg   MCHC 32.7 30.0 - 36.0 g/dL   RDW 13.4 11.5 - 15.5 %   Platelets 200 150 - 400 K/uL    Comment: DELTA CHECK NOTED  Protime-INR     Status: None   Collection Time: 10/17/14  4:16 AM  Result Value Ref Range   Prothrombin Time 13.8 11.6 - 15.2 seconds   INR 1.05 0.00 - 1.49  Procalcitonin     Status: None   Collection Time: 10/18/14  4:53 AM  Result Value Ref Range   Procalcitonin 3.70 ng/mL    Comment:        Interpretation: PCT > 2 ng/mL: Systemic infection (sepsis) is likely, unless other causes are known. (NOTE)         ICU PCT Algorithm               Non ICU PCT Algorithm    ----------------------------     ------------------------------         PCT < 0.25 ng/mL                 PCT < 0.1 ng/mL     Stopping of antibiotics            Stopping of antibiotics       strongly encouraged.               strongly encouraged.     ----------------------------     ------------------------------       PCT level decrease by               PCT < 0.25 ng/mL       >= 80% from peak PCT       OR PCT 0.25 - 0.5 ng/mL          Stopping of antibiotics                                             encouraged.     Stopping of antibiotics           encouraged.    ----------------------------     ------------------------------       PCT level decrease by              PCT >= 0.25 ng/mL       < 80% from peak PCT        AND PCT >= 0.5 ng/mL            Continuing antibiotics                                               encouraged.       Continuing antibiotics  encouraged.    ----------------------------     ------------------------------     PCT level increase compared          PCT > 0.5 ng/mL         with peak PCT AND          PCT >= 0.5 ng/mL             Escalation of antibiotics                                          strongly encouraged.      Escalation of antibiotics        strongly encouraged.    Labs are reviewed and are pertinent for UDS + for opiates, benzos, THC.  Current Facility-Administered Medications  Medication Dose Route Frequency Provider Last Rate Last Dose  . acetaminophen (TYLENOL) tablet 650 mg  650 mg Oral Q6H PRN Samuella Cota, MD   650 mg at 10/18/14 0709  . chlorhexidine (PERIDEX) 0.12 % solution 15 mL  15 mL Mouth Rinse BID Phillips Grout, MD   15 mL at 10/18/14 0846  . enoxaparin (LOVENOX) injection 40 mg  40 mg Subcutaneous Q24H Phillips Grout, MD   40 mg at 10/17/14 2220  . ondansetron (ZOFRAN) tablet 4 mg  4 mg Oral Q6H PRN Phillips Grout, MD       Or  . ondansetron (ZOFRAN) injection 4 mg  4 mg Intravenous Q6H PRN Phillips Grout, MD      . piperacillin-tazobactam (ZOSYN) IVPB 3.375 g  3.375 g Intravenous Q8H Phillips Grout, MD   3.375 g at 10/18/14 0846    Psychiatric Specialty Exam: BP 141/85 mmHg  Pulse 108  Temp(Src) 98.3 F (36.8 C) (Oral)  Resp 24  Ht '5\' 10"'  (1.778 m)   Wt 75.025 kg (165 lb 6.4 oz)  BMI 23.73 kg/m2  SpO2 100% General Appearance: Casual and Fairly Groomed  Engineer, water:: Good  Speech: Clear and Coherent and Normal Rate  Volume: Normal  Mood: Flat, presents as indifferent to seriousness of situation  Affect: Appropriate and Congruent  Thought Process: Goal Directed  Orientation: Full (Time, Place, and Person)however, denies ANY memory of chewing 4 fentanyl patches or overdosing  Thought Content: WDL  Suicidal Thoughts: Pt denies, although it appears that he overdosed on Fentanyl  Homicidal Thoughts: No  Memory: Immediate; Fair Recent; Fair Remote; Fair  Judgement: Fair  Insight: Good  Psychomotor Activity: Normal  Concentration: Good  Recall: Good  Fund of Knowledge:Good  Language: Good  Akathisia: No  Handed:   AIMS (if indicated):   Assets: Communication Skills Desire for Improvement Resilience  Sleep:      Treatment Plan Summary: As below  Disposition: -Inpatient psychiatric admission for safety and stabilization -IVC papers as pt is a potential flight risk     *Case reviewed with Dr. Leward Quan, Elyse Jarvis, FNP-BC 10/18/2014 12:39PM

## 2014-10-19 DIAGNOSIS — T50902A Poisoning by unspecified drugs, medicaments and biological substances, intentional self-harm, initial encounter: Secondary | ICD-10-CM

## 2014-10-19 LAB — HIV ANTIBODY (ROUTINE TESTING W REFLEX): HIV-1/HIV-2 Ab: NONREACTIVE

## 2014-10-19 MED ORDER — AMOXICILLIN-POT CLAVULANATE 875-125 MG PO TABS
1.0000 | ORAL_TABLET | Freq: Two times a day (BID) | ORAL | Status: DC
  Administered 2014-10-19 – 2014-10-21 (×4): 1 via ORAL
  Filled 2014-10-19 (×4): qty 1

## 2014-10-19 NOTE — Progress Notes (Signed)
He is apparently being transferred to inpatient psychiatric care today. I will plan to sign off. Thank you for allowing me to see him with you

## 2014-10-19 NOTE — Progress Notes (Signed)
PROGRESS NOTE  [name] Conley NWG:[id number] DOB: 1966-09-04 DOA: 10/16/2014 PCP: No primary care provider on file.  Summary: 49 year old man with unknown history who presented to the emergency department with acute encephalopathy, history of overdosing on some medications, unknown. Found with 3 chewed no patches at his side, one in his mouth. Given Narcan with no real change in status. He was intubated in the emergency department. His condition quickly improved and he was extubated within 24 hours. Follow-up chest x-ray suggested aspiration and he was placed on IV antibiotics. He was successfully weaned off oxygen and remains clinically stable. He was seen by psychiatry with recommendations for IVC and inpatient psychiatric treatment.  Assessment/Plan: 1. Presumed intentional drug overdose. Fentanyl per history. Presumed suicide attempt. Patient denies. Tylenol, salicylate levels negative. 2. Aspiration pneumonia, suspected sepsis on admission with hypotension, fever, tachycardia. Lactic acid was normal. Appears clinically resolved. No longer hypoxic. 3. Acute hypercapnic respiratory failure with acute respiratory acidosis. Secondary to overdose with narcotics, resolved with supportive care and brief mechanical ventilation. 4. Acute encephalopathy. Secondary to drug overdose, resolved. 5. Acute renal failure with hyperkalemia. Resolved with fluids and supportive care. 6. Substance abuse, substance abuse mood disorder, major depressive disorder 7. Possible narcotic withdrawal, resolved. I suspect tachycardia and increased pain yesterday was related to narcotic withdrawal given the patient frequently uses fentanyl by report. Also suspect low-grade fever related to this. 8. Urine drug screen positive for benzodiazepines, opiates, marijuana.   Doing very well. Although chest x-ray was significantly abnormal, he has no hypoxia or respiratory status is grossly normal, no indication to repeat chest  x-ray.  Change to oral antibiotics.  Medically clear. Plan inpatient psychiatric stabilization  Code Status: full code DVT prophylaxis: Lovenox Family Communication: none Disposition Plan: Inpatient psychiatric treatment  Brendia Sacks, MD  [facility]  Pager [phone] If 7PM-7AM, please contact night-coverage at [email or web address], password [facility] 10/19/2014, 12:20 PM  LOS: 3 days   Consultants:  Pulmonology  Procedures:  Endotracheal intubation 1/6 >> 1/7  Antibiotics:  Zosyn 1/7 >> 1/9  Augmentin 1/9 >> 1/13  HPI/Subjective: Events yesterday noted, patient's mother died, this information was shared with patient. He was evaluated by psychiatry and felt to be a danger to himself and IVC was recommended followed by inpatient stabilization. Successfully weaned off oxygen yesterday. Had some pain and tachycardia yesterday that was relieved with morphine.  Feels much better. Breathing well. No complaints.  Objective: Filed Vitals:   10/18/14 1700 10/18/14 1800 10/18/14 2200 10/19/14 0618  BP: 134/78 122/77 132/70 141/80  Pulse: 98 88 110 95  Temp:   100.3 F (37.9 C) 99.6 F (37.6 C)  TempSrc:   Oral Oral  Resp: Height:      Weight:   72.122 kg (159 lb)   SpO2: 100% 96% 98% 93%    Intake/Output Summary (Last 24 hours) at 10/19/14 1220 Last data filed at 10/19/14 0956  Gross per 24 hour  Intake    410 ml  Output    475 ml  Net    -65 ml     Filed Weights   10/17/14 0500 10/18/14 0528 10/18/14 2200  Weight: 80.6 kg (177 lb 11.1 oz) 75.025 kg (165 lb 6.4 oz) 72.122 kg (159 lb)    Exam:     Tm 100.3, vital signs stable. No hypoxia. General:  Appears calm and comfortable, better today Cardiovascular: RRR, no m/r/g. No LE edema. Respiratory: CTA bilaterally, no w/r/r. Normal respiratory effort. Psychiatric: grossly normal  mood and affect, speech fluent and appropriate  Pertinent data: Labs  Creatinine 1.62 >> 1.23. Potassium  normalized.  ABG 7.3/63/194   ammonia 51  Lactic acid 2.4   Troponin negative  BNP 45 Imaging   1/6 CXR NAD  1/6 CT head/c-spine NAD Other  Urinalysis negative  Urine drug screen: THC, benzodiazepines, opiates  EKG ST, no acute changes  Pending data: Blood cultures no growth to date. Sputum culture ordered.  Scheduled Meds: . chlorhexidine  15 mL Mouth Rinse BID  . enoxaparin (LOVENOX) injection  40 mg Subcutaneous Q24H  . piperacillin-tazobactam (ZOSYN)  IV  3.375 g Intravenous Q8H   Continuous Infusions:    Principal Problem:   Drug overdose, intentional Active Problems:   Leukocytosis   Required emergent intubation   Altered mental status   Acute encephalopathy   Acute renal failure   Aspiration pneumonia   Time spent 15 minutes

## 2014-10-20 LAB — PROCALCITONIN: PROCALCITONIN: 1.04 ng/mL

## 2014-10-20 MED ORDER — AMOXICILLIN-POT CLAVULANATE 875-125 MG PO TABS: 1.0000 | ORAL_TABLET | Freq: Two times a day (BID) | ORAL | Status: DC

## 2014-10-20 NOTE — Progress Notes (Signed)
PROGRESS NOTE  [name] Conley WUJ:[id number] DOB: 03-29-1966 DOA: 10/16/2014 PCP: No primary care provider on file.  Summary: 49 year old man with unknown history who presented to the emergency department with acute encephalopathy, history of overdosing on some medications, unknown. Found with 3 chewed no patches at his side, one in his mouth. Given Narcan with no real change in status. He was intubated in the emergency department. His condition quickly improved and he was extubated within 24 hours. Follow-up chest x-ray suggested aspiration and he was placed on IV antibiotics. He was successfully weaned off oxygen and remains clinically stable. He was seen by psychiatry with recommendations for IVC and inpatient psychiatric treatment.  Assessment/Plan: 1. Presumed intentional drug overdose. Fentanyl. Presumed suicide attempt. Tylenol, salicylate levels negative. No apparent sequela. 2. Aspiration pneumonia, suspected sepsis on admission with hypotension, fever and tachycardia. Lactic acid normal. Clinically resolved. No hypoxia. Leukocytosis resolved. Despite chest x-ray appears patient continues to improve. 3. Acute hypercapnic respiratory failure with acute respiratory acidosis, resolved. Secondary to overdose with narcotics. Brief mechanical ventilation. 4. Acute renal failure with hyperkalemia, resolved with fluids and supportive care. 5. Substance abuse, substance abuse mood disorder and major depressive disorder 6. Possible mild chronic withdrawal, resolved. 7. Acute encephalopathy secondary to drug overdose, resolved. 8. Urine drug screen positive for benzodiazepines, opiates, marijuana.   Doing very well. Plan to continue oral antibiotics. He is medically clear for psychiatric stabilization.  Brendia Sacks, MD  [facility]  Pager [phone] If 7PM-7AM, please contact night-coverage at [email or web address], password [facility] 10/20/2014, 9:27 AM  LOS: 4 days    Consultants:  Pulmonology  Procedures:  Endotracheal intubation 1/6 >> 1/7  Antibiotics:  Zosyn 1/7 >> 1/9  Augmentin 1/9 >> 1/13  HPI/Subjective: Feels better; still coughing, no SOB. No complaints. His mother's funeral is tomorrow. "I know I can't go home yet".  Objective: Filed Vitals:   10/19/14 0618 10/19/14 1337 10/19/14 2301 10/20/14 0500  BP: 141/80 129/83 116/78 129/79  Pulse: 95 77 73 75  Temp: 99.6 F (37.6 C) 98.8 F (37.1 C) 98 F (36.7 C) 98.4 F (36.9 C)  TempSrc: Oral Oral Oral Oral  Resp: Height:      Weight:      SpO2: 93% 95% 95% 94%    Intake/Output Summary (Last 24 hours) at 10/20/14 0927 Last data filed at 10/20/14 0500  Gross per 24 hour  Intake   1440 ml  Output      0 ml  Net   1440 ml     Filed Weights   10/17/14 0500 10/18/14 0528 10/18/14 2200  Weight: 80.6 kg (177 lb 11.1 oz) 75.025 kg (165 lb 6.4 oz) 72.122 kg (159 lb)    Exam:     Afebrile, VSS General:  Appears comfortable, calm. Cardiovascular: Regular rate and rhythm, no murmur, rub or gallop.  Respiratory: Clear to auscultation bilaterally, no wheezes, rales or rhonchi. Normal respiratory effort. Psychiatric: grossly normal mood and affect, speech fluent and appropriate  Pertinent data: Labs  Creatinine 1.62 >> 1.23. Potassium normalized.  ABG 7.3/63/194   ammonia 51  Lactic acid 2.4   Troponin negative  BNP 45 Imaging   1/6 CXR NAD  1/6 CT head/c-spine NAD Other  Urinalysis negative  Urine drug screen: THC, benzodiazepines, opiates  EKG ST, no acute changes  Pending data: Blood cultures no growth to date. Sputum culture ordered but not obtained.  Scheduled Meds: . amoxicillin-clavulanate  1 tablet Oral Q12H  . chlorhexidine  15 mL Mouth Rinse BID  . enoxaparin (LOVENOX) injection  40 mg Subcutaneous Q24H   Continuous Infusions:    Principal Problem:   Drug overdose, intentional Active Problems:   Leukocytosis    Required emergent intubation   Altered mental status   Acute encephalopathy   Acute renal failure   Aspiration pneumonia

## 2014-10-20 NOTE — Discharge Summary (Signed)
Physician Discharge Summary  [name] Conley ZOX:[id number] DOB: June 13, 1966 DOA: 10/16/2014  PCP: No primary care provider on file.  Admit date: 10/16/2014 Discharge date: 10/20/2014  Recommendations for Follow-up:  1. Substance abuse disorder   Discharge Diagnoses:  1. Presumed intentional drug overdose with fentanyl 2. Presumed suicide attempt 3. Aspiration pneumonia 4. Suspected sepsis on admission secondary to aspiration pneumonia 5. Acute hypercapnic respiratory failure with respiratory acidosis 6. Acute renal failure with hyperkalemia 7. Substance abuse, substance abuse mood disorder, major depressive disorder 8. Possible mild narcotic withdrawal 9. Acute encephalopathy  Discharge Condition: improved Disposition: inpatient psychiatric facility  Diet recommendation: regular  Filed Weights   10/17/14 0500 10/18/14 0528 10/18/14 2200  Weight: 80.6 kg (177 lb 11.1 oz) 75.025 kg (165 lb 6.4 oz) 72.122 kg (159 lb)    History of present illness:  49 year old man with unknown history who presented to the emergency department with acute encephalopathy, history of overdosing on some medications, unknown. Found with 3 chewed no patches at his side, one in his mouth. Given Narcan with no real change in status. He was intubated in the emergency department. Admitted for presumed drug overdose, respiratory failure.  Hospital Course:  His condition rapidly improved and he was extubated within 24 hours. Mental status cleared spontaneously. Follow-up chest x-ray revealed aspiration which was treated with antibiotics. He was successfully weaned off oxygen and remains clinically stable. Patient seen by psychiatry with recommendations for IVC and inpatient psychiatric treatment. During his hospitalization, his mother passed away at home; the patient has dealt well with this thus far. Her funeral is in the next 24 hours.  1. Presumed intentional drug overdose. Fentanyl. Presumed suicide attempt.  Tylenol, salicylate levels negative. No apparent sequela. 2. Aspiration pneumonia, suspected sepsis on admission with hypotension, fever and tachycardia. Lactic acid normal. Clinically resolved. No hypoxia. Leukocytosis resolved. Despite chest x-ray appears patient continues to improve. 3. Acute hypercapnic respiratory failure with acute respiratory acidosis, resolved. Secondary to overdose with narcotics. Brief mechanical ventilation. 4. Acute renal failure with hyperkalemia, resolved with fluids and supportive care. 5. Substance abuse, substance abuse mood disorder and major depressive disorder 6. Possible mild chronic withdrawal, resolved. 7. Acute encephalopathy secondary to drug overdose, resolved. 8. Urine drug screen positive for benzodiazepines, opiates, marijuana.   Doing very well. Plan to continue oral antibiotics. He is medically clear for psychiatric stabilization. Consultants:  Pulmonology  Procedures:  Endotracheal intubation 1/6 >> 1/7  Antibiotics:  Zosyn 1/7 >> 1/9  Augmentin 1/9 >> 1/13  Discharge Instructions  Discharge Instructions    Activity as tolerated - No restrictions    Complete by:  As directed      Diet general    Complete by:  As directed           Current Discharge Medication List    START taking these medications   Details  amoxicillin-clavulanate (AUGMENTIN) 875-125 MG per tablet Take 1 tablet by mouth every 12 (twelve) hours. Last day 1/13.       Allergies  Allergen Reactions  . Aspirin     Gi upset     The results of significant diagnostics from this hospitalization (including imaging, microbiology, ancillary and laboratory) are listed below for reference.    Significant Diagnostic Studies: Ct Head Wo Contrast  10/16/2014   CLINICAL DATA:  Altered mental status.  Possible drug overdose.  EXAM: CT HEAD WITHOUT CONTRAST  CT CERVICAL SPINE WITHOUT CONTRAST  TECHNIQUE: Multidetector CT imaging of the head and cervical spine was  performed following the standard protocol without intravenous contrast. Multiplanar CT image reconstructions of the cervical spine were also generated.  COMPARISON:  None.  FINDINGS: CT HEAD FINDINGS  The brain appears normal without hemorrhage, infarct, mass lesion, mass effect, midline shift or abnormal extra-axial fluid collection. No hydrocephalus or pneumocephalus. The calvarium is intact. Imaged paranasal sinuses and mastoid air cells are clear.  CT CERVICAL SPINE FINDINGS  Vertebral body height and alignment are maintained. There is some loss of disc space height and endplate spurring at C4-5. Endotracheal tube and NG tube hernia. Lung apices are clear.  IMPRESSION: No acute finding head or cervical spine.  Mild C4-5 degenerative disc disease.   Electronically Signed   By: Drusilla Kanner M.D.   On: 10/16/2014 20:11   Ct Cervical Spine Wo Contrast  10/16/2014   CLINICAL DATA:  Altered mental status.  Possible drug overdose.  EXAM: CT HEAD WITHOUT CONTRAST  CT CERVICAL SPINE WITHOUT CONTRAST  TECHNIQUE: Multidetector CT imaging of the head and cervical spine was performed following the standard protocol without intravenous contrast. Multiplanar CT image reconstructions of the cervical spine were also generated.  COMPARISON:  None.  FINDINGS: CT HEAD FINDINGS  The brain appears normal without hemorrhage, infarct, mass lesion, mass effect, midline shift or abnormal extra-axial fluid collection. No hydrocephalus or pneumocephalus. The calvarium is intact. Imaged paranasal sinuses and mastoid air cells are clear.  CT CERVICAL SPINE FINDINGS  Vertebral body height and alignment are maintained. There is some loss of disc space height and endplate spurring at C4-5. Endotracheal tube and NG tube hernia. Lung apices are clear.  IMPRESSION: No acute finding head or cervical spine.  Mild C4-5 degenerative disc disease.   Electronically Signed   By: Drusilla Kanner M.D.   On: 10/16/2014 20:11   Portable Chest 1  View  10/17/2014   CLINICAL DATA:  Intubation.  EXAM: PORTABLE CHEST - 1 VIEW  COMPARISON:  10/16/2014.  FINDINGS: Endotracheal tube and NG tube in stable position. Borderline cardiomegaly with pulmonary vascular prominence and bilateral pulmonary alveolar infiltrates. Congestive heart failure could present in this fashion. ARDS and/ or bilateral pneumonia could present in this fashion. No pleural effusion or pneumothorax. Resection of the distal left clavicle.  IMPRESSION: 1. Lines and tubes in stable position. 2. Borderline cardiomegaly.  Pulmonary venous congestion. 3. Bilateral pulmonary alveolar infiltrates again noted. Congestive heart failure pulmonary edema could present in this fashion. ARDS and/ or bilateral pneumonia could present in this fashion.   Electronically Signed   By: Maisie Fus  Register   On: 10/17/2014 07:26   Despite these findings, clinically the patient has resolved pneumonia, no leukocytosis, fever or shortness of breath.  Dg Chest Portable 1 View  10/16/2014   CLINICAL DATA:  Altered consciousness, possible drug overdose  EXAM: PORTABLE CHEST - 1 VIEW  COMPARISON:  None.  FINDINGS: Cardiomediastinal silhouette is unremarkable. No acute infiltrate or pleural effusion. No pulmonary edema. Endotracheal tube with tip 5.8 cm above the carina. NG tube in place. No pneumothorax.  IMPRESSION: No infiltrate or pulmonary edema. Endotracheal and NG tube in place. No pneumothorax.   Electronically Signed   By: Natasha Mead M.D.   On: 10/16/2014 18:26   Dg Chest Port 1v Same Day  10/16/2014   CLINICAL DATA:  Endotracheal tube placement.  EXAM: PORTABLE CHEST - 1 VIEW SAME DAY  COMPARISON:  Earlier the same day at 1817 hr  FINDINGS: 20:34 hr: Endotracheal tube is 4.7 cm from the carina. Enteric tube  in place, tip and side port below the diaphragm in the stomach. Developing hazy opacity in the right lung base. Increasing left perihilar opacity. The cardiomediastinal contours are normal. Question  developing vascular congestion. There is widening of left acromioclavicular joint, similar to prior.  IMPRESSION: 1. Endotracheal tube 4.7 cm from the carina.  Enteric tube in place. 2. Developing hazy opacity at the right lung base and left perihilar opacity, question pleural effusion and atelectasis. Given distribution, aspiration could have a similar appearance. Question developing vascular congestion.   Electronically Signed   By: Rubye OaksMelanie  Ehinger M.D.   On: 10/16/2014 20:48    Microbiology: Recent Results (from the past 240 hour(s))  MRSA PCR Screening     Status: None   Collection Time: 10/16/14 10:25 PM  Result Value Ref Range Status   MRSA by PCR NEGATIVE NEGATIVE Final    Comment:        The GeneXpert MRSA Assay (FDA approved for NASAL specimens only), is one component of a comprehensive MRSA colonization surveillance program. It is not intended to diagnose MRSA infection nor to guide or monitor treatment for MRSA infections.   Culture, blood (routine x 2)     Status: None (Preliminary result)   Collection Time: 10/17/14 12:56 AM  Result Value Ref Range Status   Specimen Description BLOOD LEFT ANTECUBITAL  Final   Special Requests   Final    BOTTLES DRAWN AEROBIC AND ANAEROBIC AEB=9CC ANA=4CC   Culture NO GROWTH 2 DAYS  Final   Report Status PENDING  Incomplete  Culture, blood (routine x 2)     Status: None (Preliminary result)   Collection Time: 10/17/14 12:56 AM  Result Value Ref Range Status   Specimen Description BLOOD LEFT HAND  Final   Special Requests   Final    BOTTLES DRAWN AEROBIC AND ANAEROBIC AEB=6CC ANA=2CC   Culture NO GROWTH 2 DAYS  Final   Report Status PENDING  Incomplete     Labs: Basic Metabolic Panel:  Recent Labs Lab 10/16/14 1806 10/16/14 1824 10/17/14 0416  NA 144 137 136  K 5.4* 5.3* 3.7  CL 97 96 99  CO2 33*  --  30  GLUCOSE 320* 316* 102*  BUN 12 14 10   CREATININE 1.62* 1.50* 1.23  CALCIUM 9.2  --  7.6*   Liver Function  Tests:  Recent Labs Lab 10/16/14 1806 10/17/14 0416  AST 32 35  ALT 34 28  ALKPHOS 65 43  BILITOT 0.4 0.7  PROT 7.2 5.5*  ALBUMIN 4.6 3.3*    Recent Labs Lab 10/16/14 2026  AMMONIA 51*   CBC:  Recent Labs Lab 10/16/14 1806 10/16/14 1824 10/17/14 0416  WBC 18.0*  --  8.2  NEUTROABS 11.7*  --   --   HGB 13.4 15.6 12.0*  HCT 42.5 46.0 36.7*  MCV 95.1  --  91.3  PLT 361  --  200   Cardiac Enzymes:  Recent Labs Lab 10/16/14 2247 10/17/14 0416  TROPONINI <0.03 <0.03    Principal Problem:   Drug overdose, intentional Active Problems:   Leukocytosis   Required emergent intubation   Altered mental status   Acute encephalopathy   Acute renal failure   Aspiration pneumonia   Time coordinating discharge: 20 minutes  Signed:  Brendia Sacksaniel Goodrich, MD [facility] 10/20/2014, 9:34 AM

## 2014-10-20 NOTE — Progress Notes (Signed)
Sylvaalled Tywan, [phone], SW on call, to have him start psych placement for pt who has been medically cleared.  Will continue to follow.

## 2014-10-21 ENCOUNTER — Encounter (HOSPITAL_COMMUNITY): Payer: Self-pay | Admitting: *Deleted

## 2014-10-21 DIAGNOSIS — T404X1A Poisoning by other synthetic narcotics, accidental (unintentional), initial encounter: Principal | ICD-10-CM

## 2014-10-21 DIAGNOSIS — F1110 Opioid abuse, uncomplicated: Secondary | ICD-10-CM | POA: Insufficient documentation

## 2014-10-21 MED ORDER — AMOXICILLIN-POT CLAVULANATE 875-125 MG PO TABS: 1.0000 | ORAL_TABLET | Freq: Two times a day (BID) | ORAL | Status: DC

## 2014-10-21 NOTE — Discharge Summary (Addendum)
Physician Discharge Summary  [name] PoserJerry Conley BMW:[id number]:[id number] DOB: 1966/08/10 DOA: 10/16/2014  PCP: No primary care provider on file. given information on [name] Bowerlara Conley for PCP and for outpatient counseling resources  Admit date: 10/16/2014 Discharge date: 10/21/2014  Recommendations for Outpatient Follow-up:  1. Substance abuse disorder. Patient counseled on the danger of using fentanyl. 2. Resolution of aspiration pneumonia. 3. Recommend outpatient counseling but the patient has declined this at this point.    Discharge Diagnoses:  1. Unintentional drug overdose with fentanyl 2. Aspiration pneumonia 3. Suspected sepsis on admission with hypotension, fever and tachycardia 4. Substance abuse 5. Substance abuse mood disorder 6. Acute hypercapnic respiratory failure with acute respiratory acidosis 7. Acute renal failure with hyperkalemia 8. Possible mild narcotic withdrawal 9. Acute encephalopathy secondary to drug overdose 10. Urine drug screen positive for benzodiazepines, opiates and marijuana  Discharge Condition: improved Disposition: home  Diet recommendation: regular  Filed Weights   10/17/14 0500 10/18/14 0528 10/18/14 2200  Weight: 80.6 kg (177 lb 11.1 oz) 75.025 kg (165 lb 6.4 oz) 72.122 kg (159 lb)    History of present illness:  49 year old man with unknown history who presented to the emergency department with acute encephalopathy, history of overdosing on some medications, unknown. Found with 3 chewed no patches at his side, one in his mouth. Given Narcan with no real change in status. He was intubated in the emergency department.   Hospital Course:  His condition quickly improved and he was extubated within 24 hours. Follow-up chest x-ray suggested aspiration and he was placed on IV antibiotics. He was successfully weaned off oxygen and a pneumonia appears to have resolved. He was seen by psychiatry with recommendations for IVC and inpatient psychiatric treatment because of  discordant history. After being medically cleared no bed was available. Upon reassessment by psychiatry today, it is felt that his overdose was unintentional, that he is not danger to himself and that he could go home. Patient declined counseling resources at this point. He does admit to chewing fentanyl patches regularly for the last 6 months for chronic pain. He understands that this could be deadly the next time. He absolutely denies suicidal attempt or suicidal ideation.  1. Unintentional drug overdose with fentanyl. Tylenol, salicylate levels negative. No apparent sequela. Takes for chronic pain and orthopedic injuries. 2. Aspiration pneumonia, suspected sepsis on admission with hypotension, fever and tachycardia. Resolved. No hypoxia. Leukocytosis resolved. Despite chest x-ray appearance patient continues to improve. 3. Substance abuse, substance abuse mood disorder 4. Acute hypercapnic respiratory failure with acute respiratory acidosis, resolved. Secondary to overdose with narcotics. Brief mechanical ventilation. 5. Acute renal failure with hyperkalemia, resolved with fluids and supportive care. 6. Possible mild chronic withdrawal, resolved 7. Acute encephalopathy secondary to drug overdose, resolved 8. Urine drug screen positive for benzodiazepines, opiates, marijuana.   Reevaluated by psychiatry today. Their opinion "no evidence of eminent risk to self or others. No longer meets criteria for inpatient admission. Supportive therapy recommended."  Patient denies SI/suicide attempt, "I love my life", no intent to harm. Educated on fentanyl abuse. Has been provided resources for counseling.  Consultants:  Pulmonology  Procedures:  Endotracheal intubation 1/6 >> 1/7  Antibiotics:  Zosyn 1/7 >> 1/9  Augmentin 1/9 >> 1/14  Discharge Instructions  Discharge Instructions    Activity as tolerated - No restrictions    Complete by:  As directed   Call your physician or seek  immediate medical attention for fever, shortness of breath, depression, thoughts of hurting self or worsening  of condition.     Activity as tolerated - No restrictions    Complete by:  As directed      Diet general    Complete by:  As directed      Diet general    Complete by:  As directed      Discharge instructions    Complete by:  As directed   Call your physician or seek immediate medical attention for fever, shortness of breath, depression, harmful thoughts or worsening of condition.          Current Discharge Medication List    START taking these medications   Details  amoxicillin-clavulanate (AUGMENTIN) 875-125 MG per tablet Take 1 tablet by mouth every 12 (twelve) hours. Last day 1/13. Qty: 7 tablet, Refills: 0       Allergies  Allergen Reactions  . Aspirin     Gi upset     The results of significant diagnostics from this hospitalization (including imaging, microbiology, ancillary and laboratory) are listed below for reference.    Significant Diagnostic Studies: Ct Head Wo Contrast  10/16/2014   CLINICAL DATA:  Altered mental status.  Possible drug overdose.  EXAM: CT HEAD WITHOUT CONTRAST  CT CERVICAL SPINE WITHOUT CONTRAST  TECHNIQUE: Multidetector CT imaging of the head and cervical spine was performed following the standard protocol without intravenous contrast. Multiplanar CT image reconstructions of the cervical spine were also generated.  COMPARISON:  None.  FINDINGS: CT HEAD FINDINGS  The brain appears normal without hemorrhage, infarct, mass lesion, mass effect, midline shift or abnormal extra-axial fluid collection. No hydrocephalus or pneumocephalus. The calvarium is intact. Imaged paranasal sinuses and mastoid air cells are clear.  CT CERVICAL SPINE FINDINGS  Vertebral body height and alignment are maintained. There is some loss of disc space height and endplate spurring at C4-5. Endotracheal tube and NG tube hernia. Lung apices are clear.  IMPRESSION: No acute  finding head or cervical spine.  Mild C4-5 degenerative disc disease.   Electronically Signed   By: Drusilla Kanner M.D.   On: 10/16/2014 20:11   Ct Cervical Spine Wo Contrast  10/16/2014   CLINICAL DATA:  Altered mental status.  Possible drug overdose.  EXAM: CT HEAD WITHOUT CONTRAST  CT CERVICAL SPINE WITHOUT CONTRAST  TECHNIQUE: Multidetector CT imaging of the head and cervical spine was performed following the standard protocol without intravenous contrast. Multiplanar CT image reconstructions of the cervical spine were also generated.  COMPARISON:  None.  FINDINGS: CT HEAD FINDINGS  The brain appears normal without hemorrhage, infarct, mass lesion, mass effect, midline shift or abnormal extra-axial fluid collection. No hydrocephalus or pneumocephalus. The calvarium is intact. Imaged paranasal sinuses and mastoid air cells are clear.  CT CERVICAL SPINE FINDINGS  Vertebral body height and alignment are maintained. There is some loss of disc space height and endplate spurring at C4-5. Endotracheal tube and NG tube hernia. Lung apices are clear.  IMPRESSION: No acute finding head or cervical spine.  Mild C4-5 degenerative disc disease.   Electronically Signed   By: Drusilla Kanner M.D.   On: 10/16/2014 20:11   Portable Chest 1 View  10/17/2014   CLINICAL DATA:  Intubation.  EXAM: PORTABLE CHEST - 1 VIEW  COMPARISON:  10/16/2014.  FINDINGS: Endotracheal tube and NG tube in stable position. Borderline cardiomegaly with pulmonary vascular prominence and bilateral pulmonary alveolar infiltrates. Congestive heart failure could present in this fashion. ARDS and/ or bilateral pneumonia could present in this fashion. No pleural effusion or  pneumothorax. Resection of the distal left clavicle.  IMPRESSION: 1. Lines and tubes in stable position. 2. Borderline cardiomegaly.  Pulmonary venous congestion. 3. Bilateral pulmonary alveolar infiltrates again noted. Congestive heart failure pulmonary edema could present in this  fashion. ARDS and/ or bilateral pneumonia could present in this fashion.   Electronically Signed   By: Maisie Fus  Register   On: 10/17/2014 07:26   Dg Chest Portable 1 View  10/16/2014   CLINICAL DATA:  Altered consciousness, possible drug overdose  EXAM: PORTABLE CHEST - 1 VIEW  COMPARISON:  None.  FINDINGS: Cardiomediastinal silhouette is unremarkable. No acute infiltrate or pleural effusion. No pulmonary edema. Endotracheal tube with tip 5.8 cm above the carina. NG tube in place. No pneumothorax.  IMPRESSION: No infiltrate or pulmonary edema. Endotracheal and NG tube in place. No pneumothorax.   Electronically Signed   By: Natasha Mead M.D.   On: 10/16/2014 18:26   Dg Chest Port 1v Same Day  10/16/2014   CLINICAL DATA:  Endotracheal tube placement.  EXAM: PORTABLE CHEST - 1 VIEW SAME DAY  COMPARISON:  Earlier the same day at 1817 hr  FINDINGS: 20:34 hr: Endotracheal tube is 4.7 cm from the carina. Enteric tube in place, tip and side port below the diaphragm in the stomach. Developing hazy opacity in the right lung base. Increasing left perihilar opacity. The cardiomediastinal contours are normal. Question developing vascular congestion. There is widening of left acromioclavicular joint, similar to prior.  IMPRESSION: 1. Endotracheal tube 4.7 cm from the carina.  Enteric tube in place. 2. Developing hazy opacity at the right lung base and left perihilar opacity, question pleural effusion and atelectasis. Given distribution, aspiration could have a similar appearance. Question developing vascular congestion.   Electronically Signed   By: Rubye Oaks M.D.   On: 10/16/2014 20:48    Microbiology: Recent Results (from the past 240 hour(s))  MRSA PCR Screening     Status: None   Collection Time: 10/16/14 10:25 PM  Result Value Ref Range Status   MRSA by PCR NEGATIVE NEGATIVE Final    Comment:        The GeneXpert MRSA Assay (FDA approved for NASAL specimens only), is one component of a comprehensive  MRSA colonization surveillance program. It is not intended to diagnose MRSA infection nor to guide or monitor treatment for MRSA infections.   Culture, blood (routine x 2)     Status: None (Preliminary result)   Collection Time: 10/17/14 12:56 AM  Result Value Ref Range Status   Specimen Description BLOOD LEFT ANTECUBITAL  Final   Special Requests   Final    BOTTLES DRAWN AEROBIC AND ANAEROBIC AEB=9CC ANA=4CC   Culture NO GROWTH 4 DAYS  Final   Report Status PENDING  Incomplete  Culture, blood (routine x 2)     Status: None (Preliminary result)   Collection Time: 10/17/14 12:56 AM  Result Value Ref Range Status   Specimen Description BLOOD LEFT HAND  Final   Special Requests   Final    BOTTLES DRAWN AEROBIC AND ANAEROBIC AEB=6CC ANA=2CC   Culture NO GROWTH 4 DAYS  Final   Report Status PENDING  Incomplete     Labs: Basic Metabolic Panel:  Recent Labs Lab 10/16/14 1806 10/16/14 1824 10/17/14 0416  NA 144 137 136  K 5.4* 5.3* 3.7  CL 97 96 99  CO2 33*  --  30  GLUCOSE 320* 316* 102*  BUN 12 14 10   CREATININE 1.62* 1.50* 1.23  CALCIUM 9.2  --  7.6*   Liver Function Tests:  Recent Labs Lab 10/16/14 1806 10/17/14 0416  AST 32 35  ALT 34 28  ALKPHOS 65 43  BILITOT 0.4 0.7  PROT 7.2 5.5*  ALBUMIN 4.6 3.3*    Recent Labs Lab 10/16/14 2026  AMMONIA 51*   CBC:  Recent Labs Lab 10/16/14 1806 10/16/14 1824 10/17/14 0416  WBC 18.0*  --  8.2  NEUTROABS 11.7*  --   --   HGB 13.4 15.6 12.0*  HCT 42.5 46.0 36.7*  MCV 95.1  --  91.3  PLT 361  --  200   Cardiac Enzymes:  Recent Labs Lab 10/16/14 2247 10/17/14 0416  TROPONINI <0.03 <0.03    Principal Problem:   Accidental drug overdose Active Problems:   Leukocytosis   Required emergent intubation   Altered mental status   Acute encephalopathy   Acute renal failure   Aspiration pneumonia   Opiate abuse, continuous   Time coordinating discharge: 35 minutes  Signed:  Brendia Sacks,  MD [facility] 10/21/2014, 1:03 PM

## 2014-10-21 NOTE — Consult Note (Signed)
Telepsych Consultation   Reason for Consult:  Overdose on Fentanyl (reported as accidental) Referring Physician:  Dr. Irene Limbo ([facility]) [name] Conley is an 49 y.o. male.  Assessment: AXIS I:  Substance Abuse and Substance Induced Mood Disorder, Overdose AXIS II:  Deferred AXIS III:   Past Medical History  Diagnosis Date  . Back pain    AXIS IV:  other psychosocial or environmental problems and problems related to social environment AXIS V:  51-60 moderate symptoms  Plan:  No evidence of imminent risk to self or others at present.   Patient does not meet criteria for psychiatric inpatient admission. Supportive therapy provided about ongoing stressors. Refer to IOP. Discussed crisis plan, support from social network, calling 911, coming to the Emergency Department, and calling Suicide Hotline.  Subjective:   [name] Conley is a 49 y.o. male patient admitted with overdose on Fentanyl. He was intubated briefly but extubated within 24 hours. Since he presented as coherent, he has denied any suicidal or self-harm ideations. Pt is alert/oriented x4, calm, cooperative, and answering questions appropriately. He expresses interest in discharging soon so that he can attend his mother's funeral, who died while he was inpatient. Pt also denies HI and AVH and does not appear to be responding to internal stimuli. This NP spoke to his attending hospitalist, Dr. Irene Limbo, who is in agreement with discharge and has cleared him medically. This NP also spoke to his sister, Marylene Land [phone]), who reports that she is staying with him and that she will keep him safe; denies any weapons or firearms in house or dangerous medications. Pt also denies weapons in the house. Social worker has been coordinating with pt for followup regarding substance abuse, for which he has expressed a desire to obtain treatment.   HPI:  49 year old man with unknown history who presented to the emergency department with acute encephalopathy,  history of overdosing on some medications, unknown. Found with 3 chewed no patches at his side, one in his mouth. Given Narcan with no real change in status. He was intubated in the emergency department. He has been in the hospital 5 days, in which time frame his mother passed away from reported cardiac arrest. Pt has been cooperative and appropriate with all staff members during his stay and has consistently denied suicidal ideation, intent, or history of such.    HPI Elements:   Location:  Psychiatric. Quality:  Stable  Severity:  Minimal currently. Timing:  Intermittent. Duration:  Transient. Context:  Exacerbation of underlying substance abuse, substance induced mood disorder  Past Psychiatric History: Past Medical History  Diagnosis Date  . Back pain     reports that he has quit smoking. He does not have any smokeless tobacco history on file. He reports that he does not drink alcohol. His drug history is not on file. History reviewed. No pertinent family history.   Living Arrangements: Other relatives   Allergies:   Allergies  Allergen Reactions  . Aspirin     Gi upset     ACT Assessment Complete:  Yes:    Educational Status    Risk to Self: Risk to self with the past 6 months Is patient at risk for suicide?: Yes Substance abuse history and/or treatment for substance abuse?: Yes  Risk to Others:    Abuse: Abuse/Neglect Assessment (Assessment to be complete while patient is alone) Physical Abuse: Denies Verbal Abuse: Denies Sexual Abuse: Denies Exploitation of patient/patient's resources: Denies Self-Neglect: Denies  Prior Inpatient Therapy:    Prior Outpatient  Therapy:    Additional Information:        Objective: Blood pressure 123/81, pulse 77, temperature 98.9 F (37.2 C), temperature source Oral, resp. rate 18, height  (1.778 m), weight 72.122 kg (159 lb), SpO2 93 %.Body mass index is 22.81 kg/(m^2). Results for orders placed or performed during the  hospital encounter of 10/16/14 (from the past 72 hour(s))  Procalcitonin     Status: None   Collection Time: 10/20/14  6:31 AM  Result Value Ref Range   Procalcitonin 1.04 ng/mL    Comment:        Interpretation: PCT > 0.5 ng/mL and <= 2 ng/mL: Systemic infection (sepsis) is possible, but other conditions are known to elevate PCT as well. (NOTE)         ICU PCT Algorithm               Non ICU PCT Algorithm    ----------------------------     ------------------------------         PCT < 0.25 ng/mL                 PCT < 0.1 ng/mL     Stopping of antibiotics            Stopping of antibiotics       strongly encouraged.               strongly encouraged.    ----------------------------     ------------------------------       PCT level decrease by               PCT < 0.25 ng/mL       >= 80% from peak PCT       OR PCT 0.25 - 0.5 ng/mL          Stopping of antibiotics                                             encouraged.     Stopping of antibiotics           encouraged.    ----------------------------     ------------------------------       PCT level decrease by              PCT >= 0.25 ng/mL       < 80% from peak PCT        AND PCT >= 0.5 ng/mL             Continuing antibiotics                                              encouraged.       Continuing antibiotics            encouraged.    ----------------------------     ------------------------------     PCT level increase compared          PCT > 0.5 ng/mL         with peak PCT AND          PCT >= 0.5 ng/mL             Escalation of antibiotics  strongly encouraged.      Escalation of antibiotics        strongly encouraged.    Labs are reviewed and are pertinent for UDS + for opiates, benzos, THC.  Current Facility-Administered Medications  Medication Dose Route Frequency Provider Last Rate Last Dose  . acetaminophen (TYLENOL) tablet 650 mg  650 mg Oral Q6H PRN Standley Brookinganiel P Goodrich, MD    650 mg at 10/18/14 1453  . amoxicillin-clavulanate (AUGMENTIN) 875-125 MG per tablet 1 tablet  1 tablet Oral Q12H Standley Brookinganiel P Goodrich, MD   1 tablet at 10/21/14 0901  . chlorhexidine (PERIDEX) 0.12 % solution 15 mL  15 mL Mouth Rinse BID Haydee Monicaachal A David, MD   15 mL at 10/20/14 2100  . enoxaparin (LOVENOX) injection 40 mg  40 mg Subcutaneous Q24H Haydee Monicaachal A David, MD   40 mg at 10/20/14 2200  . HYDROcodone-acetaminophen (NORCO/VICODIN) 5-325 MG per tablet 1 tablet  1 tablet Oral Q6H PRN Standley Brookinganiel P Goodrich, MD   1 tablet at 10/21/14 0902  . ondansetron (ZOFRAN) tablet 4 mg  4 mg Oral Q6H PRN Haydee Monicaachal A David, MD   4 mg at 10/18/14 2205   Or  . ondansetron (ZOFRAN) injection 4 mg  4 mg Intravenous Q6H PRN Haydee Monicaachal A David, MD        Psychiatric Specialty Exam: BP 123/81 mmHg  Pulse 77  Temp(Src) 98.9 F (37.2 C) (Oral)  Resp 18  Ht 5\' 10"  (1.778 m)  Wt 72.122 kg (159 lb)  BMI 22.81 kg/m2  SpO2 93% General Appearance: Casual and Fairly Groomed  Eye Contact:: Good  Speech: Clear and Coherent and Normal Rate  Volume: Normal  Mood: Euthymic  Affect: Appropriate and Congruent  Thought Process: Goal Directed  Orientation: Full (Time, Place, and Person)  Thought Content: WDL  Suicidal Thoughts: Pt denies, has denied since admission for 5 days consistently  Homicidal Thoughts: No  Memory: Immediate; Fair Recent; Fair Remote; Fair  Judgement: Fair  Insight: Good  Psychomotor Activity: Normal  Concentration: Good  Recall: Good  Fund of Knowledge:Good  Language: Good  Akathisia: No  Handed:   AIMS (if indicated):   Assets: Communication Skills Desire for Improvement Resilience  Sleep:      Treatment Plan Summary: As below  Disposition: -Discharge home with outpatient resources as provided by Child psychotherapistocial Worker (substance abuse)  -Rescind IVC    *Case reviewed with Dr. Yetta BarreKumar  [place], Everardo AllJohn C, FNP-BC 10/21/2014 11:22 AM

## 2014-10-21 NOTE — Progress Notes (Addendum)
PROGRESS NOTE  Loukas Antonson ZOX:[id number] DOB: 03-30-1966 DOA: 10/16/2014 PCP: No primary care provider on file. given information on [name] for PCP and for outpatient counseling resources  Summary: 49 year old man with unknown history who presented to the emergency department with acute encephalopathy, history of overdosing on some medications, unknown. Found with 3 chewed no patches at his side, one in his mouth. Given Narcan with no real change in status. He was intubated in the emergency department. His condition quickly improved and he was extubated within 24 hours. Follow-up chest x-ray suggested aspiration and he was placed on IV antibiotics. He was successfully weaned off oxygen and remains clinically stable. He was seen by psychiatry with recommendations for IVC and inpatient psychiatric treatment.  Assessment/Plan: 1. Unintentional drug overdose with fentanyl. Tylenol, salicylate levels negative. No apparent sequela. Takes for chronic pain and orthopedic injuries. 2. Aspiration pneumonia, suspected sepsis on admission with hypotension, fever and tachycardia. Resolved. No hypoxia. Leukocytosis resolved. Despite chest x-ray appearance patient continues to improve. 3. Substance abuse, substance abuse mood disorder 4. Acute hypercapnic respiratory failure with acute respiratory acidosis, resolved. Secondary to overdose with narcotics. Brief mechanical ventilation. 5. Acute renal failure with hyperkalemia, resolved with fluids and supportive care. 6. Possible mild chronic withdrawal, resolved 7. Acute encephalopathy secondary to drug overdose, resolved 8. Urine drug screen positive for benzodiazepines, opiates, marijuana.   Reevaluated by psychiatry today. Their opinion "no evidence of eminent risk to self or others. No longer meets criteria for inpatient admission. Supportive therapy recommended."  Patient denies SI/suicide attempt, "I love my life", no intent to harm. Educated on  fentanyl abuse. Has been provided resources for counseling.  Brendia Sacks, MD  [facility]  Pager [phone] If 7PM-7AM, please contact night-coverage at [email or web address], password [facility] 10/21/2014, 12:46 PM  LOS: 5 days   Consultants:  Pulmonology  Procedures:  Endotracheal intubation 1/6 >> 1/7  Antibiotics:  Zosyn 1/7 >> 1/9  Augmentin 1/9 >> 1/14  HPI/Subjective: Feels better, no complaints, breathing fine, still some cough. Ambulating without difficulty. Absolutely denies suicide attempt or ideation. Has been chewing fentanyl for 6 months for chronic back and arm pain related to orthopedic injuries.  Objective: Filed Vitals:   10/20/14 0500 10/20/14 1315 10/20/14 2136 10/21/14 0602  BP: 129/79 136/87 133/83 123/81  Pulse: 75 78 63 77  Temp: 98.4 F (36.9 C) 98.2 F (36.8 C) 98.6 F (37 C) 98.9 F (37.2 C)  TempSrc: Oral Oral Oral Oral  Resp: Height:      Weight:      SpO2: 94% 97% 94% 93%    Intake/Output Summary (Last 24 hours) at 10/21/14 1246 Last data filed at 10/21/14 0604  Gross per 24 hour  Intake    960 ml  Output      0 ml  Net    960 ml     Filed Weights   10/17/14 0500 10/18/14 0528 10/18/14 2200  Weight: 80.6 kg (177 lb 11.1 oz) 75.025 kg (165 lb 6.4 oz) 72.122 kg (159 lb)    Exam:     Afebrile, vitals stable, no hypoxia General:  Appears calm and comfortable Cardiovascular: RRR, no m/r/g. No LE edema. Respiratory: CTA bilaterally, no w/r/r, diminished breath sounds. Normal respiratory effort. Psychiatric: grossly normal mood and affect, speech fluent and appropriate  Pertinent data: Labs  Creatinine 1.62 >> 1.23. Potassium normalized.  ABG 7.3/63/194   ammonia 51  Lactic acid 2.4   Troponin negative  BNP 45 Imaging  1/6 CXR NAD  1/6 CT head/c-spine NAD Other  Urinalysis negative  Urine drug screen: THC, benzodiazepines, opiates  EKG ST, no acute changes  Pending data: Blood cultures no  growth to date. Sputum culture ordered but not obtained.  Scheduled Meds: . amoxicillin-clavulanate  1 tablet Oral Q12H  . chlorhexidine  15 mL Mouth Rinse BID  . enoxaparin (LOVENOX) injection  40 mg Subcutaneous Q24H   Continuous Infusions:    Principal Problem:   Accidental drug overdose Active Problems:   Leukocytosis   Required emergent intubation   Altered mental status   Acute encephalopathy   Acute renal failure   Aspiration pneumonia   Opiate abuse, continuous

## 2014-10-21 NOTE — Clinical Social Work Note (Addendum)
CSW faxed clinicals to [facility] and [facility] as [facility] availability.   IVC was rescinded on 10/21/14. CSW sent rescind paperwork to [employer].    CSW provided patient with a list of area Behavioral Health and Substance Abuse providers. Patient took the list and indicated that he would refer to the list should he need services upon discharge. CSW offered to make appointments for patient with a behavioral health provider and a substance abuse provider, patient declined assistance and indicated that he was just ready leave the facility.      CSW signing off.   Tretha SciaraHeather Chritopher Coster, [place] [phone]

## 2014-10-21 NOTE — Progress Notes (Signed)
Patient discharged home with family.  IV removed - WNL.  Instructed on new medication and emphasized importance of completing dose of abx.  List of OP resources and on [name] fiven by SW and CM.  Voucher also given for prescriptions per CM.  Patient verbalizes understanding of DC instructions.  No questions at this time.  Stable to DC home, left floor ambulatory with assist from sitter.

## 2014-10-22 ENCOUNTER — Encounter (HOSPITAL_COMMUNITY): Payer: Self-pay | Admitting: Emergency Medicine

## 2014-10-22 LAB — CULTURE, BLOOD (ROUTINE X 2)
CULTURE: NO GROWTH
Culture: NO GROWTH

## 2015-04-02 ENCOUNTER — Emergency Department (HOSPITAL_COMMUNITY)
Admission: EM | Admit: 2015-04-02 | Discharge: 2015-04-02 | Disposition: A | Discharge status: Home / Self Care | Payer: Disability Insurance | Attending: Emergency Medicine | Admitting: Emergency Medicine

## 2015-04-02 ENCOUNTER — Encounter (HOSPITAL_COMMUNITY): Payer: Self-pay | Admitting: Emergency Medicine

## 2015-04-02 ENCOUNTER — Emergency Department (HOSPITAL_COMMUNITY): Payer: Disability Insurance

## 2015-04-02 DIAGNOSIS — R0789 Other chest pain: Secondary | ICD-10-CM | POA: Insufficient documentation

## 2015-04-02 DIAGNOSIS — Z87891 Personal history of nicotine dependence: Secondary | ICD-10-CM | POA: Insufficient documentation

## 2015-04-02 DIAGNOSIS — R0602 Shortness of breath: Secondary | ICD-10-CM | POA: Insufficient documentation

## 2015-04-02 DIAGNOSIS — M62830 Muscle spasm of back: Secondary | ICD-10-CM

## 2015-04-02 LAB — CBC WITH DIFFERENTIAL/PLATELET
BASOS PCT: 0 % (ref 0–1)
Basophils Absolute: 0 10*3/uL (ref 0.0–0.1)
EOS ABS: 0.1 10*3/uL (ref 0.0–0.7)
Eosinophils Relative: 2 % (ref 0–5)
HCT: 44.1 % (ref 39.0–52.0)
Hemoglobin: 15 g/dL (ref 13.0–17.0)
LYMPHS ABS: 2 10*3/uL (ref 0.7–4.0)
Lymphocytes Relative: 27 % (ref 12–46)
MCH: 30.2 pg (ref 26.0–34.0)
MCHC: 34 g/dL (ref 30.0–36.0)
MCV: 88.9 fL (ref 78.0–100.0)
Monocytes Absolute: 0.4 10*3/uL (ref 0.1–1.0)
Monocytes Relative: 6 % (ref 3–12)
NEUTROS ABS: 4.8 10*3/uL (ref 1.7–7.7)
Neutrophils Relative %: 65 % (ref 43–77)
PLATELETS: 262 10*3/uL (ref 150–400)
RBC: 4.96 MIL/uL (ref 4.22–5.81)
RDW: 14.1 % (ref 11.5–15.5)
WBC: 7.3 10*3/uL (ref 4.0–10.5)

## 2015-04-02 LAB — D-DIMER, QUANTITATIVE (NOT AT ARMC)

## 2015-04-02 LAB — BASIC METABOLIC PANEL
Anion gap: 11 (ref 5–15)
BUN: 11 mg/dL (ref 6–20)
CO2: 25 mmol/L (ref 22–32)
CREATININE: 1.05 mg/dL (ref 0.61–1.24)
Calcium: 9.1 mg/dL (ref 8.9–10.3)
Chloride: 102 mmol/L (ref 101–111)
GFR calc Af Amer: >60 mL/min (ref >60)
Glucose, Bld: 86 mg/dL (ref 65–99)
Potassium: 4 mmol/L (ref 3.5–5.1)
Sodium: 138 mmol/L (ref 135–145)

## 2015-04-02 LAB — TROPONIN I: Troponin I: <0.03 ng/mL (ref <0.031)

## 2015-04-02 MED ORDER — DIAZEPAM 2 MG PO TABS: 2.0000 mg | ORAL_TABLET | Freq: Two times a day (BID) | ORAL | Status: DC

## 2015-04-02 MED ORDER — CYCLOBENZAPRINE HCL 10 MG PO TABS: 10.0000 mg | ORAL_TABLET | Freq: Two times a day (BID) | ORAL | Status: DC | PRN

## 2015-04-02 MED ORDER — IBUPROFEN 800 MG PO TABS: 800.0000 mg | ORAL_TABLET | Freq: Three times a day (TID) | ORAL | Status: DC

## 2015-04-02 MED ORDER — HYDROCODONE-ACETAMINOPHEN 5-325 MG PO TABS
2.0000 | ORAL_TABLET | Freq: Once | ORAL | Status: AC
  Administered 2015-04-02: 2 via ORAL
  Filled 2015-04-02: qty 2

## 2015-04-02 NOTE — Discharge Instructions (Signed)
Muscle Cramps and Spasms Take the muscle relaxers as prescribed.  Do not take if you are working or driving.  Return to the ED if you develop new or worsening symptoms. Muscle cramps and spasms occur when a muscle or muscles tighten and you have no control over this tightening (involuntary muscle contraction). They are a common problem and can develop in any muscle. The most common place is in the calf muscles of the leg. Both muscle cramps and muscle spasms are involuntary muscle contractions, but they also have differences:   Muscle cramps are sporadic and painful. They may last a few seconds to a quarter of an hour. Muscle cramps are often more forceful and last longer than muscle spasms.  Muscle spasms may or may not be painful. They may also last just a few seconds or much longer. CAUSES  It is uncommon for cramps or spasms to be due to a serious underlying problem. In many cases, the cause of cramps or spasms is unknown. Some common causes are:   Overexertion.   Overuse from repetitive motions (doing the same thing over and over).   Remaining in a certain position for a long period of time.   Improper preparation, form, or technique while performing a sport or activity.   Dehydration.   Injury.   Side effects of some medicines.   Abnormally low levels of the salts and ions in your blood (electrolytes), especially potassium and calcium. This could happen if you are taking water pills (diuretics) or you are pregnant.  Some underlying medical problems can make it more likely to develop cramps or spasms. These include, but are not limited to:   Diabetes.   Parkinson disease.   Hormone disorders, such as thyroid problems.   Alcohol abuse.   Diseases specific to muscles, joints, and bones.   Blood vessel disease where not enough blood is getting to the muscles.  HOME CARE INSTRUCTIONS   Stay well hydrated. Drink enough water and fluids to keep your urine clear or  pale yellow.  It may be helpful to massage, stretch, and relax the affected muscle.  For tight or tense muscles, use a warm towel, heating pad, or hot shower water directed to the affected area.  If you are sore or have pain after a cramp or spasm, applying ice to the affected area may relieve discomfort.  Put ice in a plastic bag.  Place a towel between your skin and the bag.  Leave the ice on for 15-20 minutes, 03-04 times a day.  Medicines used to treat a known cause of cramps or spasms may help reduce their frequency or severity. Only take over-the-counter or prescription medicines as directed by your caregiver. SEEK MEDICAL CARE IF:  Your cramps or spasms get more severe, more frequent, or do not improve over time.  MAKE SURE YOU:   Understand these instructions.  Will watch your condition.  Will get help right away if you are not doing well or get worse. Document Released: 03/19/2002 Document Revised: 01/22/2013 Document Reviewed: 09/13/2012 [facility] Patient Information 2015 [place], [place]. This information is not intended to replace advice given to you by your health care provider. Make sure you discuss any questions you have with your health care provider.

## 2015-04-02 NOTE — ED Provider Notes (Signed)
CSN: [id number]     Arrival date & time 04/02/15  1559 History   First MD Initiated Contact with Patient 04/02/15 1617     Chief Complaint  Patient presents with  . Back Pain  . 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 He was doing the same thing today when suddenly around 12:30 PM he felt a pain underneath his right scapula that radiates around his right chest that is constant. It is worse with palpation, movement and deep breathing. He feels somewhat short of breath. He denies any fever or cough. There is a smoker but denies any history of asthma or COPD. No cardiac history. Did not have any pain with moving dysuria last week. Denies any direct trauma. Denies any leg pain or leg swelling. Has not taken any pain medication.  Patient is a 49 y.o. male presenting with shortness of breath. The history is provided by the patient.  Shortness of Breath Associated symptoms: no abdominal pain, no chest pain, no cough, no fever, no headaches, no neck pain, no rash and no vomiting     Past Medical History  Diagnosis Date  . Back pain    Past Surgical History  Procedure Laterality Date  . Appendectomy    . Skin biopsy    . Elbow surgery    . Spinal fusion    . Shoulder surgery    . Arm debridement     Family History  Problem Relation Age of Onset  . Hypertension Father   . Asthma Sister    History  Substance Use Topics  . Smoking status: Former Smoker -- 0.25 packs/day    Types: Cigarettes    Quit date: 02/06/2013  . Smokeless tobacco: Not on file  . Alcohol Use: No    Review of Systems  Constitutional: Negative for fever, activity change and appetite change.  HENT: Negative for congestion and rhinorrhea.   Eyes: Negative for visual disturbance.  Respiratory: Positive for chest  tightness and shortness of breath. Negative for cough.   Cardiovascular: Negative for chest pain.  Gastrointestinal: Negative for nausea, vomiting and abdominal pain.  Genitourinary: Negative for dysuria.  Musculoskeletal: Positive for back pain. Negative for neck pain.  Skin: Negative for rash.  Neurological: Negative for dizziness, weakness and headaches.  A complete 10 system review of systems was obtained and all systems are negative except as noted in the HPI and PMH.      Allergies  Aspirin and Aspirin  Home Medications   Prior to Admission medications   Medication Sig Start Date End Date Taking? Authorizing Provider  Aspirin-Acetaminophen-Caffeine (GOODY HEADACHE PO) Take 1 tablet by mouth daily as needed (for pain).   Yes Historical Provider, MD  cyclobenzaprine (FLEXERIL) 10 MG tablet Take 1 tablet (10 mg total) by mouth 2 (two) times daily as needed for muscle spasms. 04/02/15   Glynn Octave, MD  diazepam (VALIUM) 2 MG tablet Take 1 tablet (2 mg total) by mouth 2 (two) times daily. 04/02/15   Glynn Octave, MD  ibuprofen (ADVIL,MOTRIN) 800 MG tablet Take 1 tablet (800 mg total) by mouth 3 (three) times daily. 04/02/15   Glynn Octave, MD   BP 124/89 mmHg  Pulse 66  Temp(Src) 98 F (36.7 C) (Oral)  Resp 16  Ht 5\' 8"  (1.727 m)  Wt 150 lb (68.04 kg)  BMI 22.81 kg/m2  SpO2 99% Physical Exam  Constitutional: He is oriented to person, place, and time. He appears well-developed and well-nourished. No distress.  HENT:  Head: Normocephalic and atraumatic.  Mouth/Throat: Oropharynx is clear and moist. No oropharyngeal exudate.  Eyes: Conjunctivae and EOM are normal. Pupils are equal, round, and reactive to light.  Neck: Normal range of motion. Neck supple.  No meningismus.  Cardiovascular: Normal rate, regular rhythm, normal heart sounds and intact distal pulses.   No murmur heard.  Equal radial pulses and grip strengths bilaterally  Pulmonary/Chest: Effort normal and  breath sounds normal. No respiratory distress. He exhibits tenderness.  TTP R chest wall  Abdominal: Soft. There is no tenderness. There is no rebound and no guarding.  Musculoskeletal: Normal range of motion. He exhibits tenderness. He exhibits no edema.  R mid back tenderness under scapula No rash  Neurological: He is alert and oriented to person, place, and time. No cranial nerve deficit. He exhibits normal muscle tone. Coordination normal.  No ataxia on finger to nose bilaterally. No pronator drift. 5/5 strength throughout. CN 2-12 intact. Negative Romberg. Equal grip strength. Sensation intact. Gait is normal.   Skin: Skin is warm.  Psychiatric: He has a normal mood and affect. His behavior is normal.  Nursing note and vitals reviewed.   ED Course  Procedures (including critical care time) Labs Review Labs Reviewed  CBC WITH DIFFERENTIAL/PLATELET  BASIC METABOLIC PANEL  TROPONIN I  D-DIMER, QUANTITATIVE (NOT AT [facility])    Imaging Review Dg Chest 2 View  04/02/2015   CLINICAL DATA:  Pain after lifting  EXAM: CHEST  2 VIEW  COMPARISON:  August 03, 2011  FINDINGS: Lungs are clear. Heart size and pulmonary vascularity are normal. No adenopathy. No pneumothorax. No bone lesions.  IMPRESSION: No edema or consolidation.   Electronically Signed   By: Bretta Bang III M.D.   On: 04/02/2015 17:24     EKG Interpretation   Date/Time:  Wednesday April 02 2015 16:29:58 EDT Ventricular Rate:  83 PR Interval:  125 QRS Duration: 87 QT Interval:  330 QTC Calculation: 388 R Axis:   85 Text Interpretation:  Sinus rhythm Baseline wander in lead(s) II aVF No  previous ECGs available Confirmed by Manus Gunning  MD, Narcisa Ganesh [phone]) on  04/02/2015 4:34:12 PM      MDM   Final diagnoses:  Back spasm    acute onset of right-sided mid back and chest pain with shortness of breath after lifting heavy material. Patient is not hypoxic. He is mildly tachycardic. Breath sounds are equal  bilaterally.   EKG sinus rhythm. Concern for possible pneumothorax.   chest x-ray negative. No pneumothorax or other pathology.  Pain improved after antiinflammatories and pain medication.  D-dimer negative. Low suspicion for ACS, PE, aortic dissection.   Pain reproducible to palpation and worse with arm movement. Treat with antiinflammatories and pain medication. Return precautions discussed.    Glynn Octave, MD 04/02/15 2051

## 2015-04-02 NOTE — ED Notes (Signed)
Pt states that he has been loading a truck today and started having back pain moving around under shoulder blade to chest.  States hurts to breathe or move.

## 2015-08-31 ENCOUNTER — Encounter: Payer: Self-pay | Admitting: Emergency Medicine

## 2015-08-31 ENCOUNTER — Emergency Department
Admission: EM | Admit: 2015-08-31 | Discharge: 2015-08-31 | Disposition: A | Discharge status: Home / Self Care | Payer: Self-pay | Attending: Emergency Medicine | Admitting: Emergency Medicine

## 2015-08-31 ENCOUNTER — Emergency Department: Payer: Self-pay

## 2015-08-31 DIAGNOSIS — R0789 Other chest pain: Secondary | ICD-10-CM | POA: Insufficient documentation

## 2015-08-31 DIAGNOSIS — R0781 Pleurodynia: Secondary | ICD-10-CM

## 2015-08-31 DIAGNOSIS — R8299 Other abnormal findings in urine: Secondary | ICD-10-CM | POA: Insufficient documentation

## 2015-08-31 DIAGNOSIS — Z791 Long term (current) use of non-steroidal anti-inflammatories (NSAID): Secondary | ICD-10-CM | POA: Insufficient documentation

## 2015-08-31 DIAGNOSIS — R109 Unspecified abdominal pain: Secondary | ICD-10-CM | POA: Insufficient documentation

## 2015-08-31 DIAGNOSIS — Z79899 Other long term (current) drug therapy: Secondary | ICD-10-CM | POA: Insufficient documentation

## 2015-08-31 DIAGNOSIS — Z87891 Personal history of nicotine dependence: Secondary | ICD-10-CM | POA: Insufficient documentation

## 2015-08-31 LAB — COMPREHENSIVE METABOLIC PANEL
ALT: 15 U/L — AB (ref 17–63)
AST: 25 U/L (ref 15–41)
Albumin: 4.2 g/dL (ref 3.5–5.0)
Alkaline Phosphatase: 73 U/L (ref 38–126)
Anion gap: 10 (ref 5–15)
BUN: 13 mg/dL (ref 6–20)
CHLORIDE: 103 mmol/L (ref 101–111)
CO2: 27 mmol/L (ref 22–32)
Calcium: 9.2 mg/dL (ref 8.9–10.3)
Creatinine, Ser: 0.91 mg/dL (ref 0.61–1.24)
GFR calc non Af Amer: >60 mL/min (ref >60)
Glucose, Bld: 97 mg/dL (ref 65–99)
Potassium: 3.8 mmol/L (ref 3.5–5.1)
SODIUM: 140 mmol/L (ref 135–145)
Total Bilirubin: 0.6 mg/dL (ref 0.3–1.2)
Total Protein: 6.9 g/dL (ref 6.5–8.1)

## 2015-08-31 LAB — CBC WITH DIFFERENTIAL/PLATELET
BASOS ABS: 0.1 10*3/uL (ref 0–0.1)
Basophils Relative: 1 %
EOS ABS: 0.2 10*3/uL (ref 0–0.7)
Eosinophils Relative: 2 %
HCT: 41.7 % (ref 40.0–52.0)
Hemoglobin: 14 g/dL (ref 13.0–18.0)
Lymphocytes Relative: 20 %
Lymphs Abs: 2.2 10*3/uL (ref 1.0–3.6)
MCH: 30.7 pg (ref 26.0–34.0)
MCHC: 33.5 g/dL (ref 32.0–36.0)
MCV: 91.7 fL (ref 80.0–100.0)
Monocytes Absolute: 0.9 10*3/uL (ref 0.2–1.0)
Monocytes Relative: 8 %
Neutro Abs: 7.8 10*3/uL — ABNORMAL HIGH (ref 1.4–6.5)
Neutrophils Relative %: 69 %
PLATELETS: 288 10*3/uL (ref 150–440)
RBC: 4.55 MIL/uL (ref 4.40–5.90)
RDW: 14.3 % (ref 11.5–14.5)
WBC: 11.2 10*3/uL — AB (ref 3.8–10.6)

## 2015-08-31 LAB — TROPONIN I

## 2015-08-31 LAB — URINALYSIS COMPLETE WITH MICROSCOPIC (ARMC ONLY)
BILIRUBIN URINE: NEGATIVE
Bacteria, UA: NONE SEEN
GLUCOSE, UA: NEGATIVE mg/dL
HGB URINE DIPSTICK: NEGATIVE
KETONES UR: NEGATIVE mg/dL
LEUKOCYTES UA: NEGATIVE
Nitrite: NEGATIVE
Protein, ur: NEGATIVE mg/dL
Specific Gravity, Urine: >1.06 — ABNORMAL HIGH (ref 1.005–1.030)
pH: 6 (ref 5.0–8.0)

## 2015-08-31 LAB — FIBRIN DERIVATIVES D-DIMER (ARMC ONLY): Fibrin derivatives D-dimer (ARMC): 638 — ABNORMAL HIGH (ref 0–499)

## 2015-08-31 LAB — LIPASE, BLOOD: Lipase: 40 U/L (ref 11–51)

## 2015-08-31 LAB — CK: CK TOTAL: 169 U/L (ref 49–397)

## 2015-08-31 MED ORDER — MORPHINE SULFATE (PF) 4 MG/ML IV SOLN
4.0000 mg | Freq: Once | INTRAVENOUS | Status: AC
  Administered 2015-08-31: 4 mg via INTRAVENOUS
  Filled 2015-08-31: qty 1

## 2015-08-31 MED ORDER — MORPHINE SULFATE (PF) 4 MG/ML IV SOLN
4.0000 mg | Freq: Once | INTRAVENOUS | Status: AC
  Administered 2015-08-31: 4 mg via INTRAVENOUS

## 2015-08-31 MED ORDER — MORPHINE SULFATE (PF) 4 MG/ML IV SOLN
INTRAVENOUS | Status: AC
  Administered 2015-08-31: 4 mg via INTRAVENOUS
  Filled 2015-08-31: qty 1

## 2015-08-31 MED ORDER — IOHEXOL 350 MG/ML SOLN
75.0000 mL | Freq: Once | INTRAVENOUS | Status: AC | PRN
  Administered 2015-08-31: 75 mL via INTRAVENOUS

## 2015-08-31 MED ORDER — SODIUM CHLORIDE 0.9 % IV BOLUS (SEPSIS)
1000.0000 mL | Freq: Once | INTRAVENOUS | Status: AC
  Administered 2015-08-31: 1000 mL via INTRAVENOUS

## 2015-08-31 NOTE — ED Provider Notes (Signed)
[facility] Emergency Department Provider Note REMINDER - THIS NOTE IS NOT A FINAL MEDICAL RECORD UNTIL IT IS SIGNED. UNTIL THEN, THE CONTENT BELOW MAY REFLECT INFORMATION FROM A DOCUMENTATION TEMPLATE, NOT THE ACTUAL PATIENT VISIT. ____________________________________________  Time seen: Approximately 8:30 AM  I have reviewed the triage vital signs and the nursing notes.   HISTORY  Chief Complaint Chest Pain    HPI [name] Conley is a 49 y.o. male  peds for evaluation of right-sided lower chest versus right flank pain. 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 He reports that from time to time his pain flares up, but very unusual for him to have dark urine.  He denies fevers or chills, reports his pain is significant only worse in the right chest which has to get a breath. No left-sided chest pain, no radiation to the arm or neck. Denies a history of coronary disease.  Severe pain, sharp and stabbing in the right chest worse with deep inspiration. Denies pain or burning with urination  Patient is forthcoming and tells me he has a history of a previous overdose with narcotics, but he has stopped use and is currently working. 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  Past Medical History  Diagnosis Date  . Back pain     Patient Active Problem List   Diagnosis Date Noted  . Opiate abuse, continuous   . Acute encephalopathy 10/17/2014  . Acute renal failure (HCC) 10/17/2014  . Aspiration pneumonia (HCC) 10/17/2014  . Accidental drug overdose 10/16/2014  . Leukocytosis 10/16/2014  . Required emergent intubation 10/16/2014  . Altered mental status 10/16/2014  . Renal insufficiency, mild 10/16/2014  . Back pain     Past Surgical History  Procedure Laterality Date  .  Appendectomy    . Skin biopsy    . Elbow surgery    . Spinal fusion    . Shoulder surgery    . Arm debridement      Current Outpatient Rx  Name  Route  Sig  Dispense  Refill  . acetaminophen (TYLENOL) 500 MG tablet   Oral   Take 1,000 mg by mouth every 6 (six) hours as needed for mild pain or moderate pain.         . cyclobenzaprine (FLEXERIL) 10 MG tablet   Oral   Take 1 tablet (10 mg total) by mouth 2 (two) times daily as needed for muscle spasms.   20 tablet   0   . diazepam (VALIUM) 2 MG tablet   Oral   Take 1 tablet (2 mg total) by mouth 2 (two) times daily.   8 tablet   0   . ibuprofen (ADVIL,MOTRIN) 800 MG tablet   Oral   Take 1 tablet (800 mg total) by mouth 3 (three) times daily.   21 tablet   0     Allergies Aspirin; Aspirin; and Ibuprofen  Family History  Problem Relation Age of Onset  . Hypertension Father   . Asthma Sister     Social History Social History  Substance Use Topics  . Smoking status: Former Smoker -- 0.25 packs/day    Types: Cigarettes    Quit date: 02/06/2013  . Smokeless tobacco: None  . Alcohol Use: No    Review of Systems  Constitutional: No fever/chills Eyes: No visual changes. ENT: No sore throat. Cardiovascular: See history of present illness  Respiratory: Only short of breath when the pain as severe as it "takes his breath away". Denies wheezing. Gastrointestinal: Reports severe right flank pain. No nausea, no vomiting.  No diarrhea.  No constipation. Genitourinary: Negative for dysuria. urine has been dark last 3 days. Musculoskeletal: Negative for back pain. Skin: Negative for rash. Neurological: Negative for headaches, focal weakness or numbness.  10-point ROS otherwise negative.  ____________________________________________   PHYSICAL EXAM:  VITAL SIGNS: ED Triage Vitals  Enc Vitals Group     BP 08/31/15 0740 140/107 mmHg     Pulse Rate 08/31/15 0740 97     Resp 08/31/15 0740 18     Temp 08/31/15 0740  98.2 F (36.8 C)     Temp Source 08/31/15 0740 Oral     SpO2 08/31/15 0740 96 %     Weight 08/31/15 0740 140 lb (63.504 kg)     Height 08/31/15 0740  (1.727 m)     Head Cir --      Peak Flow --      Pain Score 08/31/15 0737 10     Pain Loc --      Pain Edu? --      Excl. in GC? --    Constitutional: Alert and oriented. Well appearing and appears in distress, sitting up splinting his right side holding his right hand over his right lower chest wall wincing in pain. Eyes: Conjunctivae are normal. PERRL. EOMI. Head: Atraumatic. Nose: No congestion/rhinnorhea. Mouth/Throat: Mucous membranes are moist.  Oropharynx non-erythematous. Neck: No stridor.   Cardiovascular: Normal rate, regular rhythm. Grossly normal heart sounds.  Good peripheral circulation. Respiratory: Normal respiratory effort.  No retractions. Lungs CTAB. No obvious tenderness to the right chest wall. Gastrointestinal: Soft and nontender except along the right flank where he notes severe discomfort. No distention. No abdominal bruits. No CVA tenderness. Musculoskeletal: No lower extremity tenderness nor edema.  No joint effusions. Neurologic:  Normal speech and language. No gross focal neurologic deficits are appreciated. No gait instability. Skin:  Skin is warm, dry and intact. No rash noted. Psychiatric: Mood and affect are normal. Speech and behavior are normal.  ____________________________________________   LABS (all labs ordered are listed, but only abnormal results are displayed)  Labs Reviewed  CBC WITH DIFFERENTIAL/PLATELET - Abnormal; Notable for the following:    WBC 11.2 (*)    Neutro Abs 7.8 (*)    All other components within normal limits  COMPREHENSIVE METABOLIC PANEL - Abnormal; Notable for the following:    ALT 15 (*)    All other components within normal limits  URINALYSIS COMPLETEWITH MICROSCOPIC ([facility] ONLY) - Abnormal; Notable for the following:    Color, Urine YELLOW (*)    APPearance  CLEAR (*)    Specific Gravity, Urine >1.060 (*)    Squamous Epithelial / LPF 0-5 (*)    All other components within normal limits  FIBRIN DERIVATIVES D-DIMER ([facility] ONLY) - Abnormal; Notable for the following:    Fibrin derivatives D-dimer ([facility]) 638 (*)    All other components within normal limits  LIPASE, BLOOD  CK  TROPONIN I   ____________________________________________  EKG  ED ECG REPORT I, Marylu Dudenhoeffer, the attending physician, personally viewed and interpreted this ECG.  Date: 08/31/2015 EKG Time: 8a Rate: 90 Rhythm: normal sinus rhythm QRS Axis: normal Intervals: normal ST/T Wave abnormalities: normal Conduction Disutrbances: none Narrative Interpretation: unremarkable  ____________________________________________  RADIOLOGY  CT Angio Chest PE W/Cm &/Or Wo Cm (Final result) Result time: 08/31/15 11:00:11   Final result by Rad Results In Interface (08/31/15 11:00:11)   Narrative:   CLINICAL DATA: Right chest pain and nausea.  EXAM: CT ANGIOGRAPHY CHEST  CT ABDOMEN AND PELVIS WITH CONTRAST  TECHNIQUE: Multidetector CT imaging of the chest was performed using the standard protocol during bolus administration of intravenous contrast. Multiplanar CT image reconstructions and MIPs were obtained to evaluate the vascular anatomy. Multidetector CT imaging of the abdomen and pelvis was performed using the standard protocol during bolus administration of intravenous contrast.  CONTRAST: 75mL OMNIPAQUE IOHEXOL 350 MG/ML SOLN  COMPARISON: None.  FINDINGS: CTA CHEST FINDINGS  There is no evidence of pulmonary embolus.  The heart and great vessels are normal. There is no evidence of significant focal lung parenchymal consolidation, pleural effusion or pneumothorax. Subtle centrilobular ground-glass opacities are noted. No masses are identified.  No axillary lymphadenopathy is seen. The visualized portions of the thyroid gland are unremarkable in  appearance.  CT ABDOMEN and PELVIS FINDINGS  There is an 8 mm hypoattenuated lesion within the right lobe of the liver, too small to be accurately characterized by CT. The spleen, adrenals, pancreas, kidneys are unremarkable. The gallbladder is normal.  There is no evidence of bowel obstruction, enteritis, colitis, diverticulitis, nor appendicitis. A moderate amount of fecal retention is appreciated within the colon.  There is no evidence of abdominal aortic aneurysm. Atherosclerotic disease of the distal abdominal aorta with calcified and noncalcified plaque is noted. The celiac, SMA, IMA, portal vein, SMV are opacified.  There is no evidence of abdominal or pelvic free fluid, loculated fluid collections, masses, nor adenopathy.  No evidence of prostate enlargement.  There is no evidence of abdominal wall nor inguinal hernia.  There is no evidence of aggressive appearing osseous lesions.  There is a healing fracture of the lateral right seventh rib.  Review of the MIP images confirms the above findings. Fragmentation of the posterior process of L4 vertebral body versus soft tissue heterotopic calcifications are seen.  IMPRESSION: No evidence of abnormalities of the solid organs of the abdomen, with exception of a 8 hypoattenuated lesion within the liver, which in the absence of history of malignancy likely represents a liver cyst or hemangioma.  Normal appearance of the small and large bowel loops.  No evidence of pulmonary embolus.  Healing right lateral seventh rib fracture.   Electronically Signed By: Ted Mcalpineobrinka Dimitrova M.D. On: 08/31/2015 11:00    ____________________________________________   PROCEDURES  Procedure(s) performed: None  Critical Care performed: No  ____________________________________________   INITIAL IMPRESSION / ASSESSMENT AND PLAN / ED COURSE  Pertinent labs & imaging results that were available during my care of the  patient were reviewed by me and considered in my medical decision making (see chart for details).  Patient prresents for severe right-sided lower chest versus right flank pain for the last 3 days. He appears in distress, splinting on the right side. By exam is hold to differentiate clearly whether or not this is actual pleuritic severe right-sided chest pain versus right flank pain. He does report dark urine, and hematuria certainly considered along with kidney stone, the patient is also has known smoker and his presentation and associated severe pleurisy prompts me to order a d-dimer to evaluate for risk of pulmonary wasn't as well. I doubt acute coronary syndrome, 3 days of pain that his first troponin is negative I would essentially feel that  I could be excluded, especially given the right-sided nature of his pain and pleuritic Component.  Intra-abdominal pathology is also considered including cholelithiasis, pancreatitis, perforation, renal ischemia, etc.  The patient's pain is slightly improved after first dose of morphine, though he still considerably in pain. We'll give him additional, and we have discussed that I would not prescribe any narcotics, he is agreeable. At this point, his d-dimer is slightly elevated but he continues to have severe right flank pain as well. We'll order CT angiogram to rule out pulmonary embolus him and continue imaging into the abdomen to evaluate for abdominal or renal source. These are negative, I suspect the patient likely be discharged home.  ----------------------------------------- 12:54 PM on 08/31/2015 -----------------------------------------  Patient resting comfortably, pain improved. No obvious clear cause for his pain today, however very reassuring with a extensive workup. There is a healing rib fracture but he reports his likely happened months ago. At this point, I advised the patient he may use over-the-counter medications for pain, and follow-up is  advised with a primary care physician. I've given him resources for follow-up in the community, and discussed Return precautions including any severe worsening of his pain, syncope, passing out, vomiting, bloody stool, fevers, or other new concerns that he should return to ER. Patient is agreeable and will not be driving today. ____________________________________________   FINAL CLINICAL IMPRESSION(S) / ED DIAGNOSES  Final diagnoses:  Pleuritic pain      Sharyn Creamer, MD 08/31/15 1255

## 2015-08-31 NOTE — ED Notes (Signed)
Having pain to right rib area for the past 3 days .pain is worse today   Positive nausea

## 2015-08-31 NOTE — ED Notes (Signed)
Patient transported to X-ray 

## 2015-08-31 NOTE — Discharge Instructions (Signed)
Her CAT scan indicated a small possible cyst in her liver, this is most likely not a concerning finding but but you will need to follow-up with gastroenterology within one months time for further evaluation.  No driving today.  Return to the ER right away if your symptoms worsen, fever, difficulty breathing, your vomiting, have severe abdominal pain, or other new concerns arise.  Chest Wall Pain Chest wall pain is pain in or around the bones and muscles of your chest. Sometimes, an injury causes this pain. Sometimes, the cause may not be known. This pain may take several weeks or longer to get better. HOME CARE INSTRUCTIONS  Pay attention to any changes in your symptoms. Take these actions to help with your pain:   Rest as told by your health care provider.   Avoid activities that cause pain. These include any activities that use your chest muscles or your abdominal and side muscles to lift heavy items.   If directed, apply ice to the painful area:  Put ice in a plastic bag.  Place a towel between your skin and the bag.  Leave the ice on for 20 minutes, 2-3 times per day.  Take over-the-counter and prescription medicines only as told by your health care provider.  Do not use tobacco products, including cigarettes, chewing tobacco, and e-cigarettes. If you need help quitting, ask your health care provider.  Keep all follow-up visits as told by your health care provider. This is important. SEEK MEDICAL CARE IF:  You have a fever.  Your chest pain becomes worse.  You have new symptoms. SEEK IMMEDIATE MEDICAL CARE IF:  You have nausea or vomiting.  You feel sweaty or light-headed.  You have a cough with phlegm (sputum) or you cough up blood.  You develop shortness of breath.   This information is not intended to replace advice given to you by your health care provider. Make sure you discuss any questions you have with your health care provider.   Document Released:  09/27/2005 Document Revised: 06/18/2015 Document Reviewed: 12/23/2014 Elsevier Interactive Patient Education 2016 Elsevier Inc.  Abdominal Pain, Adult Many things can cause abdominal pain. Usually, abdominal pain is not caused by a disease and will improve without treatment. It can often be observed and treated at home. Your health care provider will do a physical exam and possibly order blood tests and X-rays to help determine the seriousness of your pain. However, in many cases, more time must pass before a clear cause of the pain can be found. Before that point, your health care provider may not know if you need more testing or further treatment. HOME CARE INSTRUCTIONS Monitor your abdominal pain for any changes. The following actions may help to alleviate any discomfort you are experiencing:  Only take over-the-counter or prescription medicines as directed by your health care provider.  Do not take laxatives unless directed to do so by your health care provider.  Try a clear liquid diet (broth, tea, or water) as directed by your health care provider. Slowly move to a bland diet as tolerated. SEEK MEDICAL CARE IF:  You have unexplained abdominal pain.  You have abdominal pain associated with nausea or diarrhea.  You have pain when you urinate or have a bowel movement.  You experience abdominal pain that wakes you in the night.  You have abdominal pain that is worsened or improved by eating food.  You have abdominal pain that is worsened with eating fatty foods.  You have a  fever. SEEK IMMEDIATE MEDICAL CARE IF:  Your pain does not go away within 2 hours.  You keep throwing up (vomiting).  Your pain is felt only in portions of the abdomen, such as the right side or the left lower portion of the abdomen.  You pass bloody or black tarry stools. MAKE SURE YOU:  Understand these instructions.  Will watch your condition.  Will get help right away if you are not doing well or  get worse.   This information is not intended to replace advice given to you by your health care provider. Make sure you discuss any questions you have with your health care provider.   Document Released: 07/07/2005 Document Revised: 06/18/2015 Document Reviewed: 06/06/2013 Elsevier Interactive Patient Education [employer] Elsevier Inc.

## 2015-08-31 NOTE — ED Notes (Signed)
MD at bedside. 

## 2015-08-31 NOTE — ED Notes (Signed)
Pt states that he is unable to urinate at this time, has urinated approx 1 time per day since symptoms began.

## 2016-06-20 ENCOUNTER — Emergency Department
Admission: EM | Admit: 2016-06-20 | Discharge: 2016-06-20 | Disposition: A | Discharge status: Home / Self Care | Payer: Self-pay | Attending: Emergency Medicine | Admitting: Emergency Medicine

## 2016-06-20 ENCOUNTER — Encounter: Payer: Self-pay | Admitting: Emergency Medicine

## 2016-06-20 ENCOUNTER — Emergency Department: Payer: Self-pay

## 2016-06-20 DIAGNOSIS — Z87891 Personal history of nicotine dependence: Secondary | ICD-10-CM | POA: Insufficient documentation

## 2016-06-20 DIAGNOSIS — M869 Osteomyelitis, unspecified: Secondary | ICD-10-CM | POA: Insufficient documentation

## 2016-06-20 MED ORDER — OXYCODONE-ACETAMINOPHEN 5-325 MG PO TABS
2.0000 | ORAL_TABLET | Freq: Once | ORAL | Status: AC
  Administered 2016-06-20: 2 via ORAL
  Filled 2016-06-20: qty 2

## 2016-06-20 MED ORDER — OXYCODONE-ACETAMINOPHEN 5-325 MG PO TABS: 1.0000 | ORAL_TABLET | ORAL | 0 refills | Status: DC | PRN

## 2016-06-20 MED ORDER — DOXYCYCLINE HYCLATE 50 MG PO CAPS: 100.0000 mg | ORAL_CAPSULE | Freq: Two times a day (BID) | ORAL | 0 refills | Status: DC

## 2016-06-20 NOTE — ED Notes (Signed)
Pt verbalized understanding of discharge instructions. NAD at this time. 

## 2016-06-20 NOTE — ED Provider Notes (Signed)
[facility] Emergency Department Provider Note  ____________________________________________  Time seen: Approximately 10:49 AM  I have reviewed the triage vital signs and the nursing notes.   HISTORY  Chief Complaint Hand Pain    HPI [name] Conley is a 50 y.o. male presents for evaluation of continued right index finger pain.patient was originally seen on August 11 and then again August 19 in [place] for a felon. He was admitted for a wound infection and placed on IV vancomycin and admitted for 3 days. Patient is here with continued pain and drainage frm his index   Past Medical History:  Diagnosis Date  . Back pain     Patient Active Problem List   Diagnosis Date Noted  . Opiate abuse, continuous   . Acute encephalopathy 10/17/2014  . Acute renal failure (HCC) 10/17/2014  . Aspiration pneumonia (HCC) 10/17/2014  . Accidental drug overdose 10/16/2014  . Leukocytosis 10/16/2014  . Required emergent intubation 10/16/2014  . Altered mental status 10/16/2014  . Renal insufficiency, mild 10/16/2014  . Back pain     Past Surgical History:  Procedure Laterality Date  . APPENDECTOMY    . ARM DEBRIDEMENT    . ELBOW SURGERY    . SHOULDER SURGERY    . SKIN BIOPSY    . SPINAL FUSION      Prior to Admission medications   Medication Sig Start Date End Date Taking? Authorizing Provider  doxycycline (VIBRAMYCIN) 50 MG capsule Take 2 capsules (100 mg total) by mouth 2 (two) times daily. 06/20/16   Evangeline Dakin, PA-C  oxyCODONE-acetaminophen (ROXICET) 5-325 MG tablet Take 1-2 tablets by mouth every 4 (four) hours as needed for severe pain. 06/20/16   Evangeline Dakin, PA-C    Allergies Aspirin; Aspirin; Bactrim [sulfamethoxazole-trimethoprim]; and Ibuprofen  Family History  Problem Relation Age of Onset  . Hypertension Father   . Asthma Sister     Social History Social History  Substance Use Topics  . Smoking status: Former Smoker   Packs/day: 0.25    Types: Cigarettes    Quit date: 02/06/2013  . Smokeless tobacco: Not on file  . Alcohol use No    Review of Systems Constitutional: No fever/chills Eyes: No visual changes. ENT: No sore throat. Cardiovascular: Denies chest pain. Respiratory: Denies shortness of breath. Gastrointestinal: No abdominal pain.  No nausea, no vomiting.  No diarrhea.  No constipation. Genitourinary: Negative for dysuria. Musculoskeletal: right index finger pain. Skin: Negative for rash. Neurological: Negative for headaches, focal weakness or numbness.  10-point ROS otherwise negative.  ____________________________________________   PHYSICAL EXAM:  VITAL SIGNS: ED Triage Vitals  Enc Vitals Group     BP 06/20/16 1043 129/75     Pulse Rate 06/20/16 1043 67     Resp 06/20/16 1043 18     Temp 06/20/16 1043 98.1 F (36.7 C)     Temp Source 06/20/16 1043 Oral     SpO2 06/20/16 1043 99 %     Weight 06/20/16 1043 130 lb (59 kg)     Height 06/20/16 1043 5\' 8"  (1.727 m)     Head Circumference --      Peak Flow --      Pain Score 06/20/16 1040 8     Pain Loc --      Pain Edu? --      Excl. in GC? --     Constitutional: Alert and oriented. Well appearing and in no acute distress. Eyes: Conjunctivae are normal. PERRL. EOMI.  Head: Atraumatic. Nose: No congestion/rhinnorhea. Mouth/Throat: Mucous membranes are moist.  Oropharynx non-erythematous. Neck: No stridor.   Cardiovascular: Normal rate, regular rhythm. Grossly normal heart sounds.  Good peripheral circulation. Respiratory: Normal respiratory effort.  No retractions. Lungs CTAB. Gastrointestinal: Soft and nontender. No distention. No abdominal bruits. No CVA tenderness. Musculoskeletal: No lower extremity tenderness nor edema.  No joint effusions. Neurologic:  Normal speech and language. No gross focal neurologic deficits are appreciated. No gait instability. Skin: distal finger infection is warm and dry with minimal  drainage. There is a soft tissue skinpossible packing noted at the distal tip with minimal drainage. He has decreased range of motion limited flexion at the DIP joint with tenderness to the distal finger. Psychiatric: Mood and affect are normal. Speech and behavior are normal.  ____________________________________________   LABS (all labs ordered are listed, but only abnormal results are displayed)  Labs Reviewed - No data to display ____________________________________________  EKG   ____________________________________________  RADIOLOGY  IMPRESSION:  No acute fracture or subluxation. Soft tissue irregularity probable  ulcer noted at the tip of the finger. There is subtle cortical  irregularity distal aspect of distal phalanx best seen on lateral  view. Findings suspicious for osteomyelitis. Further evaluation with  MRI could be performed as clinically warranted.    ____________________________________________   PROCEDURES  Procedure(s) performed: None  Critical Care performed: No  ____________________________________________   INITIAL IMPRESSION / ASSESSMENT AND PLAN / ED COURSE  Pertinent labs & imaging results that were available during my care of the patient were reviewed by me and considered in my medical decision making (see chart for details). Review of the  [place] was performed in accordance of the [facility] prior to dispensing any controlled drugs.  Early osteomyelitis of the right index finger. [place] same. No telephone call made since patient reports previous relationship with him. Patient did not receive any IV antibiotics secondary to following up with his Ortho tomorrow.   Rx started doxycycline 100 mg twice a day and Percocet 5/325. Patient follow-up with PCP or return to ER with any worsening symptomology. Patient voices no other emergency medical complaints at  this time.  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 Despite best efforts to proofread, errors can occur which can change the meaning. Any change was purely unintentional.    Evangeline Dakinharles M Beers, PA-C 06/20/16 1625    Evangeline Dakinharles M Beers, PA-C 06/28/16 1640    Evangeline Dakinharles M Beers, PA-C 07/13/16 1317    Minna AntisKevin Paduchowski, MD 07/27/16 2246

## 2016-06-20 NOTE — ED Triage Notes (Signed)
Pt with cut finger right hand 2 months ago and had finger packed. Pt says pic of packing coming out of finger.

## 2016-06-24 IMAGING — CT CT ABD-PELV W/ CM
1 of 4 series · 14 of 32 positions shown, 18 images · IV contrast (APPLIED)
Comparison: None.

CLINICAL DATA: Right chest pain and nausea.

EXAM:
CT ANGIOGRAPHY CHEST
CT ABDOMEN AND PELVIS WITH CONTRAST
TECHNIQUE: Multidetector CT imaging of the chest was performed using the
standard protocol during bolus administration of intravenous
contrast. Multiplanar CT image reconstructions and MIPs were
obtained to evaluate the vascular anatomy. Multidetector CT imaging
of the abdomen and pelvis was performed using the standard protocol
during bolus administration of intravenous contrast.
CONTRAST:  75mL OMNIPAQUE IOHEXOL 350 MG/ML SOLN

[Series 7: pe 1.0 thins · axial · 0.68mm/px · z∈[-187,+128]mm · 14 of 345 slices shown, 18 images]
[im 5/345  mediastinal]
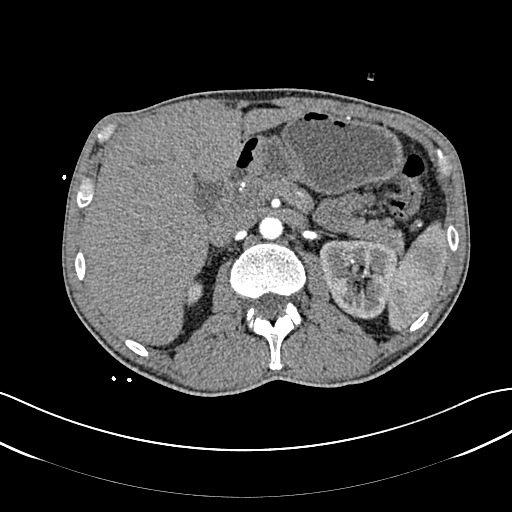
[im 5/345  lung]
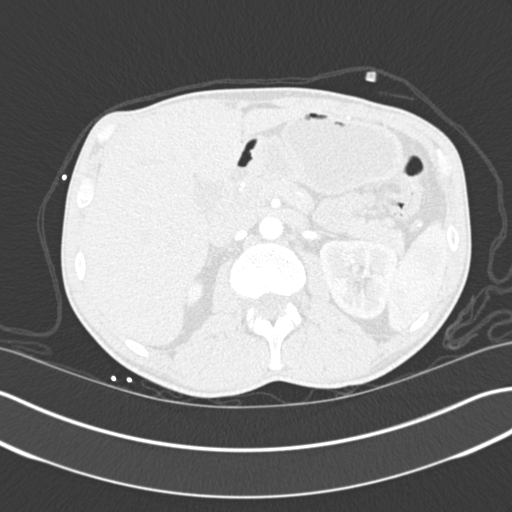
[im 25/345  lung]
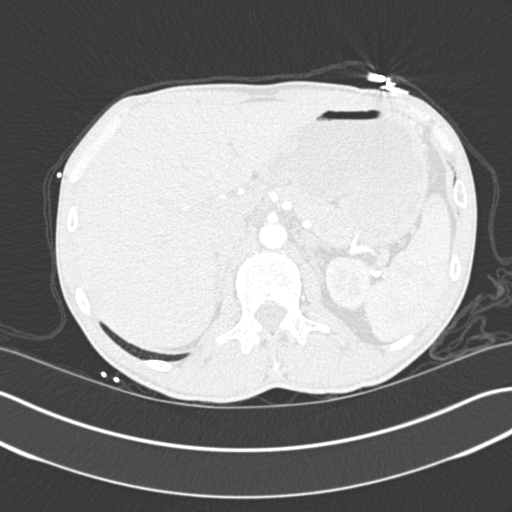
[im 50/345  lung]
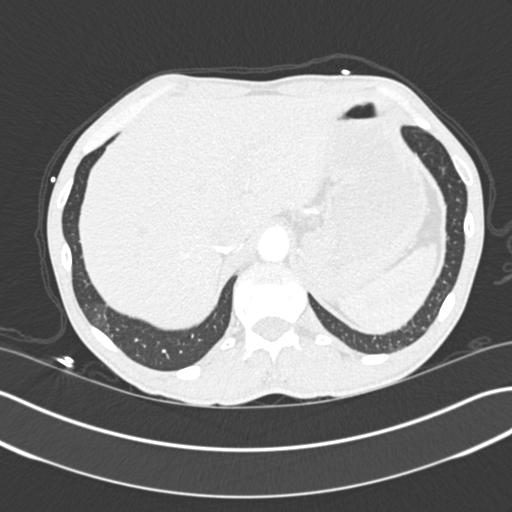
[im 74/345  lung]
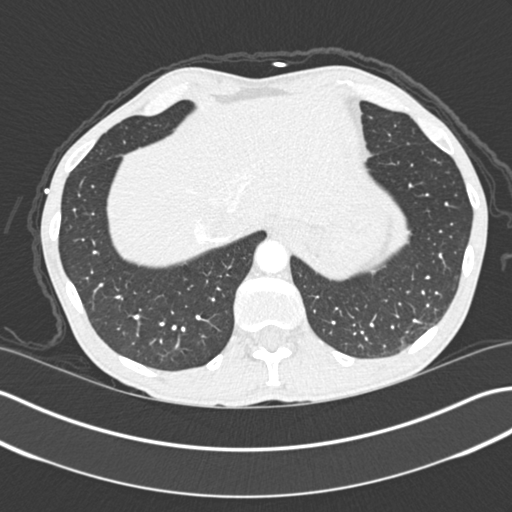
[im 99/345  mediastinal]
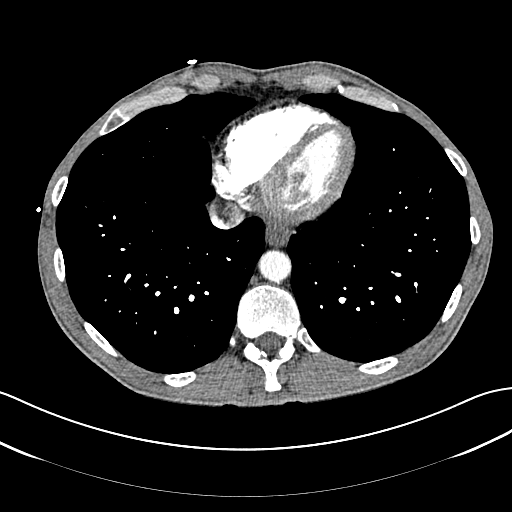
[im 99/345  lung]
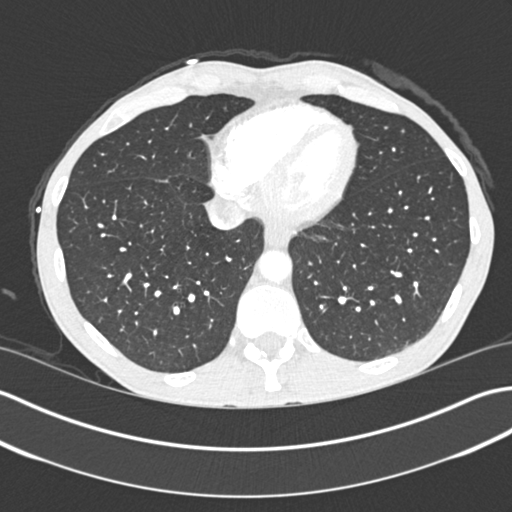
[im 123/345  lung]
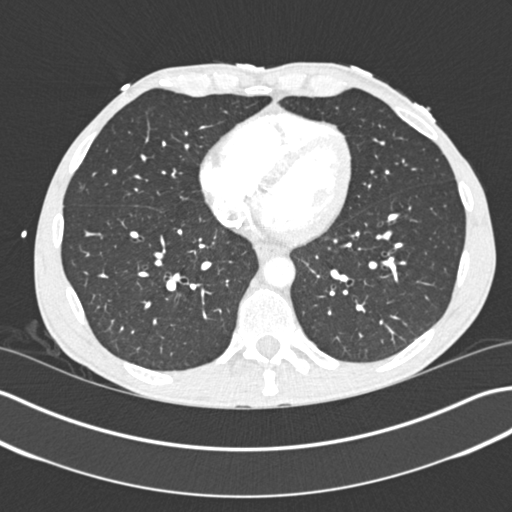
[im 148/345  lung]
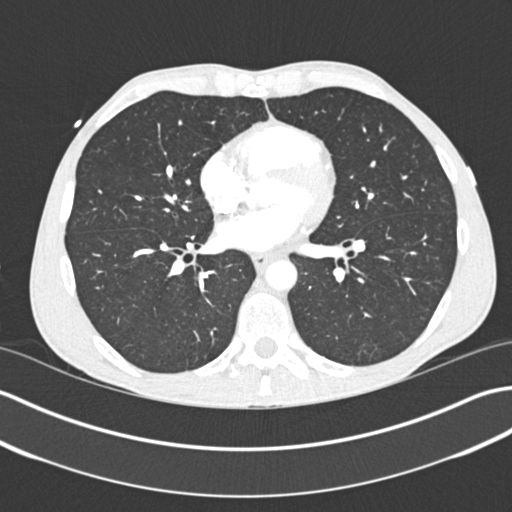
[im 173/345  lung]
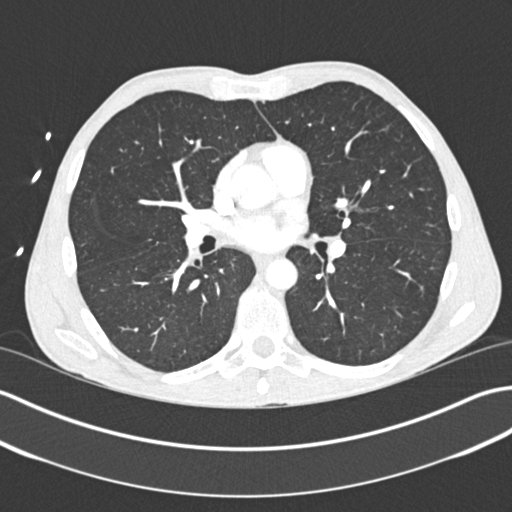
[im 197/345  mediastinal]
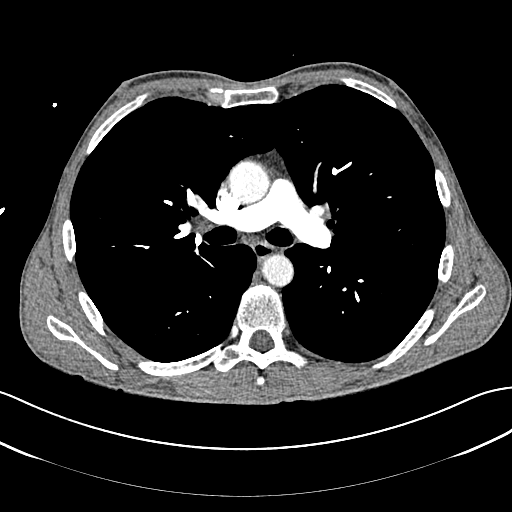
[im 197/345  lung]
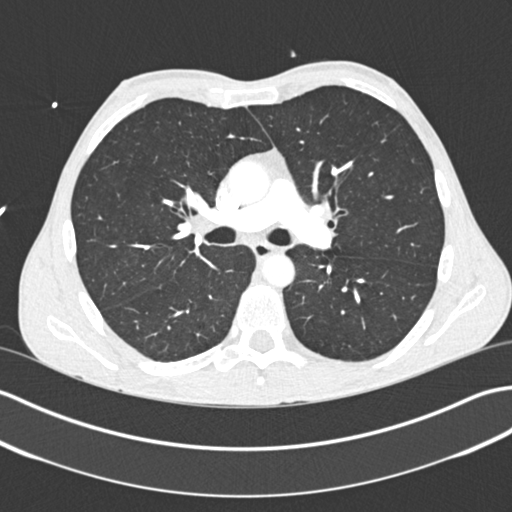
[im 222/345  lung]
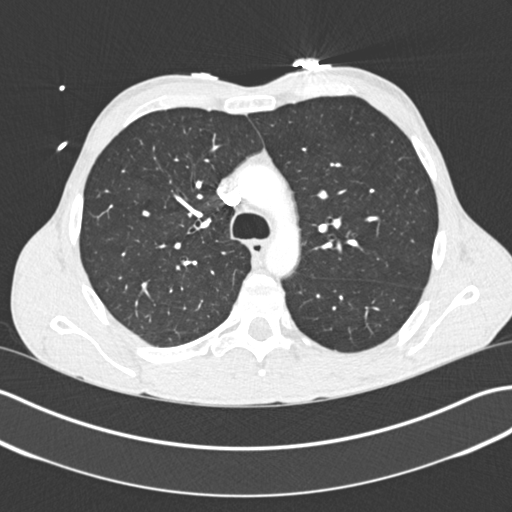
[im 246/345  lung]
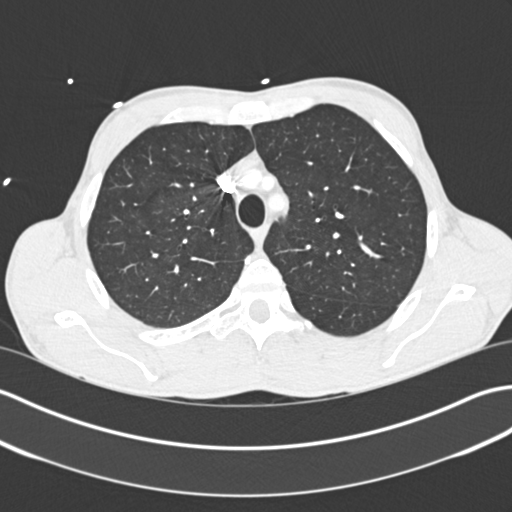
[im 271/345  lung]
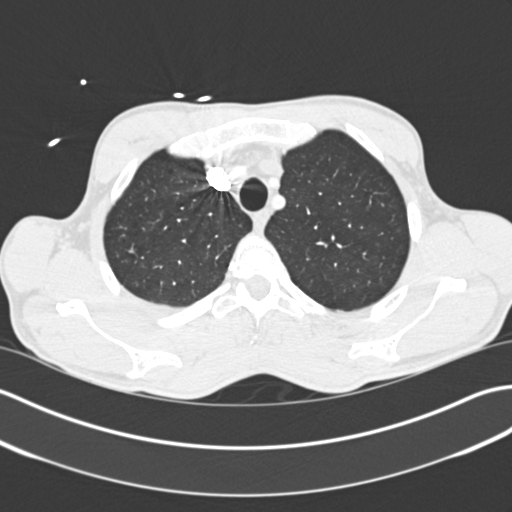
[im 295/345  mediastinal]
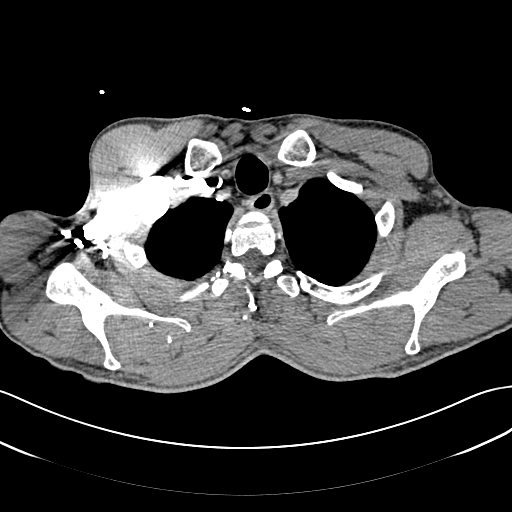
[im 295/345  lung]
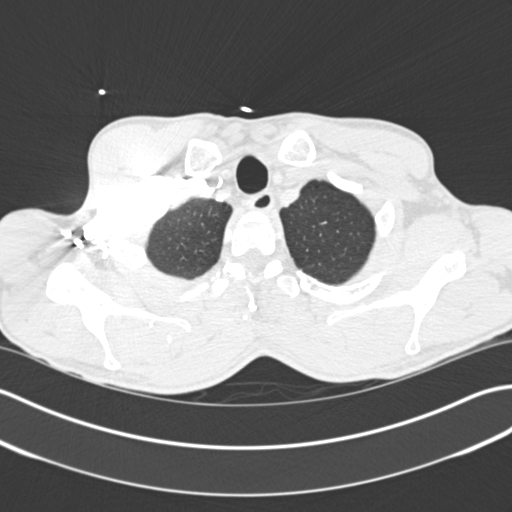
[im 320/345  lung]
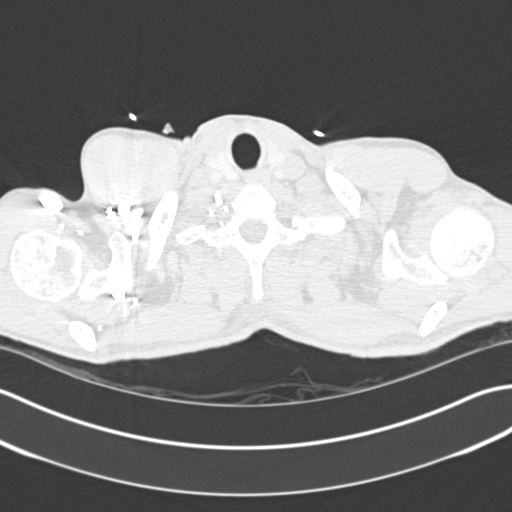

[14 of 32 positions shown; findings below may reference images not displayed]

FINDINGS: CTA CHEST FINDINGS

There is no evidence of pulmonary embolus.

The heart and great vessels are normal. There is no evidence of
significant focal lung parenchymal consolidation, pleural effusion
or pneumothorax. Subtle centrilobular ground-glass opacities are
noted. No masses are identified.

No axillary lymphadenopathy is seen. The visualized portions of the
thyroid gland are unremarkable in appearance.

CT ABDOMEN and PELVIS FINDINGS

There is an 8 mm hypoattenuated lesion within the right lobe of the
liver, too small to be accurately characterized by CT. The spleen,
adrenals, pancreas, kidneys are unremarkable. The gallbladder is
normal.

There is no evidence of bowel obstruction, enteritis, colitis,
diverticulitis, nor appendicitis. A moderate amount of fecal
retention is appreciated within the colon.

There is no evidence of abdominal aortic aneurysm. Atherosclerotic
disease of the distal abdominal aorta with calcified and
noncalcified plaque is noted. The celiac, SMA, IMA, portal vein, SMV
are opacified.

There is no evidence of abdominal or pelvic free fluid, loculated
fluid collections, masses, nor adenopathy.

No evidence of prostate enlargement.

There is no evidence of abdominal wall nor inguinal hernia.

There is no evidence of aggressive appearing osseous lesions.

There is a healing fracture of the lateral right seventh rib.

Review of the MIP images confirms the above findings. Fragmentation
of the posterior process of L4 vertebral body versus soft tissue
heterotopic calcifications are seen.
IMPRESSION: No evidence of abnormalities of the solid organs of the abdomen,
with exception of a 8 hypoattenuated lesion within the liver, which
in the absence of history of malignancy likely represents a liver
cyst or hemangioma.

Normal appearance of the small and large bowel loops.

No evidence of pulmonary embolus.

Healing right lateral seventh rib fracture.

## 2016-11-03 ENCOUNTER — Emergency Department: Payer: Self-pay

## 2016-11-03 ENCOUNTER — Emergency Department
Admission: EM | Admit: 2016-11-03 | Discharge: 2016-11-03 | Disposition: A | Discharge status: Home / Self Care | Payer: Self-pay | Attending: Emergency Medicine | Admitting: Emergency Medicine

## 2016-11-03 ENCOUNTER — Encounter: Payer: Self-pay | Admitting: *Deleted

## 2016-11-03 DIAGNOSIS — Z87891 Personal history of nicotine dependence: Secondary | ICD-10-CM | POA: Insufficient documentation

## 2016-11-03 DIAGNOSIS — Y999 Unspecified external cause status: Secondary | ICD-10-CM | POA: Insufficient documentation

## 2016-11-03 DIAGNOSIS — S9031XA Contusion of right foot, initial encounter: Secondary | ICD-10-CM | POA: Insufficient documentation

## 2016-11-03 DIAGNOSIS — Y929 Unspecified place or not applicable: Secondary | ICD-10-CM | POA: Insufficient documentation

## 2016-11-03 DIAGNOSIS — X58XXXA Exposure to other specified factors, initial encounter: Secondary | ICD-10-CM | POA: Insufficient documentation

## 2016-11-03 DIAGNOSIS — Y9389 Activity, other specified: Secondary | ICD-10-CM | POA: Insufficient documentation

## 2016-11-03 MED ORDER — TRAMADOL HCL 50 MG PO TABS: 50.0000 mg | ORAL_TABLET | Freq: Four times a day (QID) | ORAL | 0 refills | Status: DC | PRN

## 2016-11-03 MED ORDER — TRAMADOL HCL 50 MG PO TABS
50.0000 mg | ORAL_TABLET | Freq: Once | ORAL | Status: AC
  Administered 2016-11-03: 50 mg via ORAL
  Filled 2016-11-03: qty 1

## 2016-11-03 MED ORDER — MELOXICAM 7.5 MG PO TABS: 7.5000 mg | ORAL_TABLET | Freq: Every day | ORAL | 0 refills | Status: DC

## 2016-11-03 NOTE — Discharge Instructions (Signed)
Follow up with the podiatry specialist for symptoms that are not improving over the week. Return to the ER for symptoms that change or worsen if unable to schedule an appointment.

## 2016-11-03 NOTE — ED Provider Notes (Signed)
[facility] Emergency Department Provider Note ____________________________________________  Time seen: Approximately 9:35 AM  I have reviewed the triage vital signs and the nursing notes.   HISTORY  Chief Complaint Foot Pain    HPI [name] PoserJerry Conley is a 51 y.o. male who presents to the emergency department for evaluation of right heel pain. He states that while wearing tennis shoes yesterday he was outside working and was attempting to separate 2 pieces of metal, so he stomped on one of them. He has had pain since that time. He has not taken any medications.  Past Medical History:  Diagnosis Date  . Back pain     Patient Active Problem List   Diagnosis Date Noted  . Opiate abuse, continuous   . Acute encephalopathy 10/17/2014  . Acute renal failure (HCC) 10/17/2014  . Aspiration pneumonia (HCC) 10/17/2014  . Accidental drug overdose 10/16/2014  . Leukocytosis 10/16/2014  . Required emergent intubation 10/16/2014  . Altered mental status 10/16/2014  . Renal insufficiency, mild 10/16/2014  . Back pain     Past Surgical History:  Procedure Laterality Date  . APPENDECTOMY    . ARM DEBRIDEMENT    . ELBOW SURGERY    . SHOULDER SURGERY    . SKIN BIOPSY    . SPINAL FUSION      Prior to Admission medications   Medication Sig Start Date End Date Taking? Authorizing Provider  meloxicam (MOBIC) 7.5 MG tablet Take 1 tablet (7.5 mg total) by mouth daily. 11/03/16   [name] Pesterari B Shanine Kreiger, FNP  traMADol (ULTRAM) 50 MG tablet Take 1 tablet (50 mg total) by mouth every 6 (six) hours as needed. 11/03/16   [name] Pesterari B [name], FNP    Allergies Aspirin; Aspirin; Bactrim [sulfamethoxazole-trimethoprim]; and Ibuprofen  Family History  Problem Relation Age of Onset  . Hypertension Father   . Asthma Sister     Social History Social History  Substance Use Topics  . Smoking status: Former Smoker    Packs/day: 0.25    Types: Cigarettes    Quit date: 02/06/2013  .  Smokeless tobacco: Not on file  . Alcohol use No    Review of Systems Constitutional: No recent illness. Cardiovascular: Denies chest pain or palpitations. Respiratory: Denies shortness of breath. Musculoskeletal: Pain in Right heel Skin: Negative for rash, wound, lesion. Neurological: Negative for focal weakness or numbness.  ____________________________________________   PHYSICAL EXAM:  VITAL SIGNS: ED Triage Vitals  Enc Vitals Group     BP 11/03/16 0919 (!) 153/80     Pulse Rate 11/03/16 0919 (!) 106     Resp 11/03/16 0919 18     Temp 11/03/16 0919 98.2 F (36.8 C)     Temp Source 11/03/16 0919 Oral     SpO2 11/03/16 0919 98 %     Weight 11/03/16 0920 140 lb (63.5 kg)     Height 11/03/16 0920 5\' 8"  (1.727 m)     Head Circumference --      Peak Flow --      Pain Score 11/03/16 0922 8     Pain Loc --      Pain Edu? --      Excl. in GC? --     Constitutional: Alert and oriented. Well appearing and in no acute distress. Eyes: Conjunctivae are normal. EOMI. Head: Atraumatic. Neck: No stridor.  Respiratory: Normal respiratory effort.   Musculoskeletal: Tenderness to palpation over the left calcaneus. No deformity noted. Neurologic:  Normal speech and language. No gross  focal neurologic deficits are appreciated. Speech is normal. No gait instability. Skin:  Skin is warm, dry and intact. Atraumatic. Psychiatric: Mood and affect are normal. Speech and behavior are normal.  ____________________________________________   LABS (all labs ordered are listed, but only abnormal results are displayed)  Labs Reviewed - No data to display ____________________________________________  RADIOLOGY  Calcaneus is negative for acute bony abnormality per radiology. ____________________________________________   PROCEDURES  Procedure(s) performed: Ace bandage applied to the left heel/foot/ankle by ER tech. Crutches given also. Patient was neurovascularly intact  post-application.   ____________________________________________   INITIAL IMPRESSION / ASSESSMENT AND PLAN / ED COURSE     Pertinent labs & imaging results that were available during my care of the patient were reviewed by me and considered in my medical decision making (see chart for details).  51 year old male presenting to the emergency department for evaluation of left heel pain. He will be given prescriptions for meloxicam and tramadol and advised to follow-up with podiatry for symptoms that are not improving over the next week. He was instructed to return to the emergency department for symptoms that change or worsen if he is unable schedule an appointment. ____________________________________________   FINAL CLINICAL IMPRESSION(S) / ED DIAGNOSES  Final diagnoses:  Contusion of right heel, initial encounter       [name] Pester, FNP 11/03/16 1612    [name] Natal, MD 11/03/16 1701

## 2016-11-03 NOTE — ED Notes (Signed)
See triage note   States he stomped his right yesterday  Unable to bear wt on right heel

## 2016-11-03 NOTE — ED Triage Notes (Signed)
Pt states he was scrapping metal and stomped on his right foot, denies any skin breaks, states continued pain in his right heel

## 2016-11-03 NOTE — ED Notes (Signed)
Pt able top walk out of ED with crutches in NAD. PT and wife verbalized understanding of d/c teachings and follow up needed.

## 2018-07-18 ENCOUNTER — Emergency Department (HOSPITAL_COMMUNITY): Payer: No Typology Code available for payment source

## 2018-07-18 ENCOUNTER — Emergency Department (HOSPITAL_COMMUNITY)
Admission: EM | Admit: 2018-07-18 | Discharge: 2018-07-18 | Disposition: A | Discharge status: Home / Self Care | Payer: No Typology Code available for payment source | Attending: Emergency Medicine | Admitting: Emergency Medicine

## 2018-07-18 DIAGNOSIS — Y9389 Activity, other specified: Secondary | ICD-10-CM | POA: Diagnosis not present

## 2018-07-18 DIAGNOSIS — Z79899 Other long term (current) drug therapy: Secondary | ICD-10-CM | POA: Diagnosis not present

## 2018-07-18 DIAGNOSIS — S022XXB Fracture of nasal bones, initial encounter for open fracture: Secondary | ICD-10-CM | POA: Diagnosis not present

## 2018-07-18 DIAGNOSIS — G8929 Other chronic pain: Secondary | ICD-10-CM | POA: Diagnosis not present

## 2018-07-18 DIAGNOSIS — M549 Dorsalgia, unspecified: Secondary | ICD-10-CM | POA: Insufficient documentation

## 2018-07-18 DIAGNOSIS — Z87891 Personal history of nicotine dependence: Secondary | ICD-10-CM | POA: Diagnosis not present

## 2018-07-18 DIAGNOSIS — Y999 Unspecified external cause status: Secondary | ICD-10-CM | POA: Diagnosis not present

## 2018-07-18 DIAGNOSIS — S52501A Unspecified fracture of the lower end of right radius, initial encounter for closed fracture: Secondary | ICD-10-CM

## 2018-07-18 DIAGNOSIS — R52 Pain, unspecified: Secondary | ICD-10-CM | POA: Diagnosis not present

## 2018-07-18 DIAGNOSIS — Y939 Activity, unspecified: Secondary | ICD-10-CM | POA: Diagnosis not present

## 2018-07-18 DIAGNOSIS — S0181XA Laceration without foreign body of other part of head, initial encounter: Secondary | ICD-10-CM

## 2018-07-18 DIAGNOSIS — Y929 Unspecified place or not applicable: Secondary | ICD-10-CM | POA: Diagnosis not present

## 2018-07-18 DIAGNOSIS — S0990XA Unspecified injury of head, initial encounter: Secondary | ICD-10-CM | POA: Diagnosis present

## 2018-07-18 LAB — URINALYSIS, ROUTINE W REFLEX MICROSCOPIC
Bilirubin Urine: NEGATIVE
GLUCOSE, UA: NEGATIVE mg/dL
Hgb urine dipstick: NEGATIVE
KETONES UR: NEGATIVE mg/dL
LEUKOCYTES UA: NEGATIVE
NITRITE: NEGATIVE
PH: 5 (ref 5.0–8.0)
PROTEIN: NEGATIVE mg/dL
Specific Gravity, Urine: 1.028 (ref 1.005–1.030)

## 2018-07-18 LAB — CBC WITH DIFFERENTIAL/PLATELET
ABS IMMATURE GRANULOCYTES: 0.03 10*3/uL (ref 0.00–0.07)
BASOS PCT: 1 %
Basophils Absolute: 0.1 10*3/uL (ref 0.0–0.1)
Eosinophils Absolute: 0.4 10*3/uL (ref 0.0–0.5)
Eosinophils Relative: 4 %
HEMATOCRIT: 39.5 % (ref 39.0–52.0)
Hemoglobin: 13.1 g/dL (ref 13.0–17.0)
IMMATURE GRANULOCYTES: 0 %
Lymphocytes Relative: 14 %
Lymphs Abs: 1.4 10*3/uL (ref 0.7–4.0)
MCH: 31.8 pg (ref 26.0–34.0)
MCHC: 33.2 g/dL (ref 30.0–36.0)
MCV: 95.9 fL (ref 80.0–100.0)
MONOS PCT: 5 %
Monocytes Absolute: 0.5 10*3/uL (ref 0.1–1.0)
NEUTROS ABS: 7.5 10*3/uL (ref 1.7–7.7)
Neutrophils Relative %: 76 %
PLATELETS: 271 10*3/uL (ref 150–400)
RBC: 4.12 MIL/uL — ABNORMAL LOW (ref 4.22–5.81)
RDW: 13.3 % (ref 11.5–15.5)
WBC: 9.8 10*3/uL (ref 4.0–10.5)
nRBC: 0 % (ref 0.0–0.2)

## 2018-07-18 LAB — COMPREHENSIVE METABOLIC PANEL
ALBUMIN: 3.7 g/dL (ref 3.5–5.0)
ALK PHOS: 66 U/L (ref 38–126)
ALT: 19 U/L (ref 0–44)
AST: 27 U/L (ref 15–41)
Anion gap: 7 (ref 5–15)
BILIRUBIN TOTAL: 0.3 mg/dL (ref 0.3–1.2)
BUN: 20 mg/dL (ref 6–20)
CALCIUM: 9 mg/dL (ref 8.9–10.3)
CO2: 25 mmol/L (ref 22–32)
CREATININE: 0.96 mg/dL (ref 0.61–1.24)
Chloride: 105 mmol/L (ref 98–111)
GFR calc Af Amer: >60 mL/min (ref >60)
GLUCOSE: 96 mg/dL (ref 70–99)
Potassium: 4.2 mmol/L (ref 3.5–5.1)
Sodium: 137 mmol/L (ref 135–145)
TOTAL PROTEIN: 6 g/dL — AB (ref 6.5–8.1)

## 2018-07-18 LAB — I-STAT CHEM 8, ED
BUN: 24 mg/dL — ABNORMAL HIGH (ref 6–20)
CALCIUM ION: 1.15 mmol/L (ref 1.15–1.40)
CHLORIDE: 103 mmol/L (ref 98–111)
Creatinine, Ser: 0.9 mg/dL (ref 0.61–1.24)
GLUCOSE: 92 mg/dL (ref 70–99)
HCT: 39 % (ref 39.0–52.0)
Hemoglobin: 13.3 g/dL (ref 13.0–17.0)
Potassium: 4.2 mmol/L (ref 3.5–5.1)
SODIUM: 138 mmol/L (ref 135–145)
TCO2: 27 mmol/L (ref 22–32)

## 2018-07-18 LAB — RAPID URINE DRUG SCREEN, HOSP PERFORMED
Amphetamines: NOT DETECTED
Barbiturates: NOT DETECTED
Benzodiazepines: NOT DETECTED
COCAINE: POSITIVE — AB
OPIATES: NOT DETECTED
TETRAHYDROCANNABINOL: NOT DETECTED

## 2018-07-18 LAB — ETHANOL: Alcohol, Ethyl (B): <10 mg/dL (ref <10)

## 2018-07-18 MED ORDER — FENTANYL CITRATE (PF) 100 MCG/2ML IJ SOLN
100.0000 ug | Freq: Once | INTRAMUSCULAR | Status: AC
  Administered 2018-07-18: 100 ug via INTRAVENOUS
  Filled 2018-07-18: qty 2

## 2018-07-18 MED ORDER — MELOXICAM 7.5 MG PO TABS: 15.0000 mg | ORAL_TABLET | Freq: Every day | ORAL | 0 refills | Status: AC

## 2018-07-18 MED ORDER — TETANUS-DIPHTH-ACELL PERTUSSIS 5-2.5-18.5 LF-MCG/0.5 IM SUSP
0.5000 mL | Freq: Once | INTRAMUSCULAR | Status: AC
  Administered 2018-07-18: 0.5 mL via INTRAMUSCULAR
  Filled 2018-07-18: qty 0.5

## 2018-07-18 MED ORDER — ONDANSETRON HCL 4 MG/2ML IJ SOLN
4.0000 mg | Freq: Once | INTRAMUSCULAR | Status: AC
  Administered 2018-07-18: 4 mg via INTRAVENOUS
  Filled 2018-07-18: qty 2

## 2018-07-18 MED ORDER — AMOXICILLIN-POT CLAVULANATE 875-125 MG PO TABS: 1.0000 | ORAL_TABLET | Freq: Two times a day (BID) | ORAL | 0 refills | Status: AC

## 2018-07-18 MED ORDER — IOHEXOL 300 MG/ML  SOLN
100.0000 mL | Freq: Once | INTRAMUSCULAR | Status: AC | PRN
  Administered 2018-07-18: 100 mL via INTRAVENOUS

## 2018-07-18 MED ORDER — SODIUM CHLORIDE 0.9 % IV SOLN
INTRAVENOUS | Status: DC
  Administered 2018-07-18: 09:00:00 via INTRAVENOUS

## 2018-07-18 MED ORDER — HYDROCODONE-ACETAMINOPHEN 5-325 MG PO TABS: 1.0000 | ORAL_TABLET | Freq: Four times a day (QID) | ORAL | 0 refills | Status: AC | PRN

## 2018-07-18 MED ORDER — HYDROCODONE-ACETAMINOPHEN 5-325 MG PO TABS
2.0000 | ORAL_TABLET | Freq: Once | ORAL | Status: AC
  Administered 2018-07-18: 2 via ORAL
  Filled 2018-07-18: qty 2

## 2018-07-18 MED ORDER — LIDOCAINE-EPINEPHRINE 1 %-1:100000 IJ SOLN
10.0000 mL | Freq: Once | INTRAMUSCULAR | Status: AC
  Administered 2018-07-18: 10 mL
  Filled 2018-07-18: qty 10

## 2018-07-18 MED ORDER — AMOXICILLIN-POT CLAVULANATE 875-125 MG PO TABS
1.0000 | ORAL_TABLET | Freq: Once | ORAL | Status: AC
  Administered 2018-07-18: 1 via ORAL
  Filled 2018-07-18: qty 1
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
## 2018-07-18 NOTE — ED Notes (Signed)
Patient transported to CT 

## 2018-07-18 NOTE — ED Provider Notes (Signed)
LACERATION REPAIR Performed by: Jacinto Halim Authorized by: Jacinto Halim Consent: Verbal consent obtained. Risks and benefits: risks, benefits and alternatives were discussed Consent given by: patient Patient identity confirmed: provided demographic data Prepped and Draped in normal sterile fashion Wound explored  Laceration Location: Right upper lip, does not cross vermillion border  Laceration Length: 3.5 cm  No Foreign Bodies seen or palpated  Anesthesia: local infiltration  Local anesthetic: lidocaine 1 % with epinephrine   Anesthetic total: 5 ml  Irrigation method: syringe Amount of cleaning: standard  Skin closure: simple  Number of sutures: 3  Technique: simple interrupted  Patient tolerance: Patient tolerated the procedure well with no immediate complications.  LACERATION REPAIR Performed by: Jacinto Halim Authorized by: Jacinto Halim Consent: Verbal consent obtained. Risks and benefits: risks, benefits and alternatives were discussed Consent given by: patient Patient identity confirmed: provided demographic data Prepped and Draped in normal sterile fashion Wound explored  Laceration Location: above the nasal bridge  Laceration Length: 1.5 cm  No Foreign Bodies seen or palpated  Anesthesia: local infiltration  Local anesthetic: lidocaine 1 % with epinephrine  Anesthetic total: 3 ml  Irrigation method: syringe Amount of cleaning: standard  Skin closure: simple  Number of sutures: 3  Technique: simple  Patient tolerance: Patient tolerated the procedure well with no immediate complications.    [name] Conley 07/18/18 1239    Eber Hong, MD 07/18/18 1300

## 2018-07-18 NOTE — ED Notes (Signed)
Patient transported to X-ray 

## 2018-07-18 NOTE — ED Notes (Signed)
D/c reviewed with patient. Encouraged to follow up as ordered 
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
## 2018-07-18 NOTE — ED Notes (Signed)
Got patient a urinal patient is resting with call bell in reach
